# Patient Record
Sex: Male | Born: 1980 | Race: Black or African American | Hispanic: No | Marital: Single | Smoking: Current some day smoker
Health system: Southern US, Community
[De-identification: ages and names within clinical notes are randomized; demographics above are authoritative.]

## PROBLEM LIST (undated history)

## (undated) DIAGNOSIS — I219 Acute myocardial infarction, unspecified: Secondary | ICD-10-CM

## (undated) DIAGNOSIS — E785 Hyperlipidemia, unspecified: Secondary | ICD-10-CM

## (undated) DIAGNOSIS — M109 Gout, unspecified: Secondary | ICD-10-CM

## (undated) HISTORY — PX: WISDOM TOOTH EXTRACTION: SHX21

## (undated) HISTORY — PX: CARDIAC CATHETERIZATION: SHX172

---

## 2008-04-02 ENCOUNTER — Emergency Department (HOSPITAL_COMMUNITY): Admission: EM | Admit: 2008-04-02 | Discharge: 2008-04-02 | Payer: Self-pay | Admitting: Emergency Medicine

## 2008-05-26 ENCOUNTER — Emergency Department (HOSPITAL_COMMUNITY): Admission: EM | Admit: 2008-05-26 | Discharge: 2008-05-26 | Payer: Self-pay | Admitting: Emergency Medicine

## 2009-09-05 ENCOUNTER — Emergency Department (HOSPITAL_COMMUNITY): Admission: EM | Admit: 2009-09-05 | Discharge: 2009-09-05 | Payer: Self-pay | Admitting: Emergency Medicine

## 2009-12-28 ENCOUNTER — Emergency Department (HOSPITAL_COMMUNITY): Admission: EM | Admit: 2009-12-28 | Discharge: 2009-12-28 | Payer: Self-pay | Admitting: Emergency Medicine

## 2011-04-24 ENCOUNTER — Emergency Department (HOSPITAL_COMMUNITY)
Admission: EM | Admit: 2011-04-24 | Discharge: 2011-04-24 | Disposition: A | Discharge status: Home / Self Care | Payer: Self-pay | Attending: Emergency Medicine | Admitting: Emergency Medicine

## 2011-04-24 DIAGNOSIS — J45909 Unspecified asthma, uncomplicated: Secondary | ICD-10-CM | POA: Insufficient documentation

## 2011-04-24 DIAGNOSIS — S61209A Unspecified open wound of unspecified finger without damage to nail, initial encounter: Secondary | ICD-10-CM | POA: Insufficient documentation

## 2011-04-24 DIAGNOSIS — W268XXA Contact with other sharp object(s), not elsewhere classified, initial encounter: Secondary | ICD-10-CM | POA: Insufficient documentation

## 2013-01-01 ENCOUNTER — Encounter (HOSPITAL_COMMUNITY): Payer: Self-pay | Admitting: *Deleted

## 2013-01-01 ENCOUNTER — Emergency Department (HOSPITAL_COMMUNITY): Payer: BC Managed Care – PPO

## 2013-01-01 ENCOUNTER — Emergency Department (HOSPITAL_COMMUNITY)
Admission: EM | Admit: 2013-01-01 | Discharge: 2013-01-01 | Disposition: A | Discharge status: Home / Self Care | Payer: BC Managed Care – PPO | Attending: Emergency Medicine | Admitting: Emergency Medicine

## 2013-01-01 DIAGNOSIS — M109 Gout, unspecified: Secondary | ICD-10-CM | POA: Insufficient documentation

## 2013-01-01 DIAGNOSIS — F17200 Nicotine dependence, unspecified, uncomplicated: Secondary | ICD-10-CM | POA: Insufficient documentation

## 2013-01-01 MED ORDER — HYDROCODONE-ACETAMINOPHEN 5-325 MG PO TABS
1.0000 | ORAL_TABLET | Freq: Once | ORAL | Status: AC
  Administered 2013-01-01: 1 via ORAL
  Filled 2013-01-01: qty 1

## 2013-01-01 MED ORDER — IBUPROFEN 400 MG PO TABS
600.0000 mg | ORAL_TABLET | Freq: Once | ORAL | Status: AC
  Administered 2013-01-01: 600 mg via ORAL
  Filled 2013-01-01: qty 2

## 2013-01-01 MED ORDER — HYDROCODONE-ACETAMINOPHEN 5-325 MG PO TABS: 1.0000 | ORAL_TABLET | Freq: Four times a day (QID) | ORAL | Status: DC | PRN

## 2013-01-01 MED ORDER — IBUPROFEN 600 MG PO TABS: 600.0000 mg | ORAL_TABLET | Freq: Four times a day (QID) | ORAL | Status: DC | PRN

## 2013-01-01 MED ORDER — COLCHICINE 0.6 MG PO TABS
1.2000 mg | ORAL_TABLET | Freq: Once | ORAL | Status: AC
  Administered 2013-01-01: 1.2 mg via ORAL
  Filled 2013-01-01: qty 2

## 2013-01-01 NOTE — ED Notes (Signed)
Patient transported to X-ray 

## 2013-01-01 NOTE — Progress Notes (Signed)
Orthopedic Tech Progress Note Patient Details:  Evan Higgins 1981/02/16 [id number]  Ortho Devices Type of Ortho Device: Postop shoe/boot Ortho Device/Splint Location: RIGHT POST OP SHOE Ortho Device/Splint Interventions: Application   Cammer, Mickie Bail 01/01/2013, 10:26 AM

## 2013-01-01 NOTE — ED Notes (Signed)
Reports waking up yesterday morning with pain to right foot, denies injury to foot, family hx of gout.

## 2013-01-01 NOTE — ED Provider Notes (Signed)
History     CSN: [id number]  Arrival date & time 01/01/13  0758   First MD Initiated Contact with Patient 01/01/13 0805      Chief Complaint  Patient presents with  . Foot Pain    (Consider location/radiation/quality/duration/timing/severity/associated sxs/prior treatment) HPI Comments: Pain right sided great toe. No trauma. No hx of gout for self, but there is fam hx of gout.  Patient is a 32 y.o. male presenting with lower extremity pain. The history is provided by the patient.  Foot Pain This is a new problem. The current episode started yesterday. The problem occurs constantly. The problem has been gradually worsening. Pertinent negatives include no chest pain, no abdominal pain and no shortness of breath. The symptoms are aggravated by walking. The symptoms are relieved by relaxation. He has tried nothing for the symptoms.    History reviewed. No pertinent past medical history.  History reviewed. No pertinent past surgical history.  History reviewed. No pertinent family history.  History  Substance Use Topics  . Smoking status: Current Every Day Smoker    Types: Cigarettes  . Smokeless tobacco: Not on file  . Alcohol Use: No      Review of Systems  Constitutional: Negative for activity change and appetite change.  Respiratory: Negative for cough and shortness of breath.   Cardiovascular: Negative for chest pain.  Gastrointestinal: Negative for abdominal pain.  Genitourinary: Negative for dysuria.  Musculoskeletal: Positive for arthralgias.    Allergies  Review of patient's allergies indicates no known allergies.  Home Medications   Current Outpatient Rx  Name  Route  Sig  Dispense  Refill  . acetaminophen (TYLENOL) 500 MG tablet   Oral   Take 1,000 mg by mouth every 6 (six) hours as needed for pain.         Marland Kitchen HYDROcodone-acetaminophen (NORCO/VICODIN) 5-325 MG per tablet   Oral   Take 1 tablet by mouth every 6 (six) hours as needed for pain.   10  tablet   0   . ibuprofen (ADVIL,MOTRIN) 600 MG tablet   Oral   Take 1 tablet (600 mg total) by mouth every 6 (six) hours as needed for pain.   30 tablet   0     BP 133/75  Pulse 70  Temp(Src) 97.6 F (36.4 C) (Oral)  Resp 16  SpO2 98%  Physical Exam  Constitutional: He is oriented to person, place, and time. He appears well-developed.  HENT:  Head: Normocephalic and atraumatic.  Eyes: Conjunctivae and EOM are normal. Pupils are equal, round, and reactive to light.  Neck: Normal range of motion. Neck supple.  Cardiovascular: Normal rate, regular rhythm and normal heart sounds.   Pulmonary/Chest: Effort normal and breath sounds normal. No respiratory distress. He has no wheezes.  Abdominal: Soft. Bowel sounds are normal. He exhibits no distension. There is no tenderness. There is no rebound and no guarding.  Musculoskeletal:  Right great toe is edematous, mild erythema, no calor, tenderness to palpation and with passive ROM.  Neurological: He is alert and oriented to person, place, and time.  Skin: Skin is warm.    ED Course  Procedures (including critical care time)  Labs Reviewed - No data to display Dg Toe Great Right  01/01/2013  *RADIOLOGY REPORT*  Clinical Data: Pain.  No injury.  RIGHT GREAT TOE  Comparison: None.  Findings: No acute bony abnormality.  Specifically, no fracture, subluxation, or dislocation.  Soft tissues are intact. Joint spaces are maintained.  Normal  bone mineralization.  IMPRESSION: No acute bony abnormality.   Original Report Authenticated By: Charlett Nose, M.D.      1. Gout       MDM  Pt comes in with cc of great toe pain. Slight redness, and swelling - no callor. No risk factors for infection for this healthy male. Appears to be got clinically - location is classic, and there is red, swollen MTP. Will clinically dx, no uric acid ordered - as it is not diagnostic, and there is not enough effusion to drain fluid.  Advised to stay away from  red meat and etoh.   Derwood Kaplan, MD 01/01/13 [phone]

## 2013-01-24 ENCOUNTER — Encounter (HOSPITAL_COMMUNITY): Payer: Self-pay | Admitting: *Deleted

## 2013-01-24 ENCOUNTER — Emergency Department (HOSPITAL_COMMUNITY)
Admission: EM | Admit: 2013-01-24 | Discharge: 2013-01-24 | Disposition: A | Discharge status: Home / Self Care | Payer: BC Managed Care – PPO | Attending: Emergency Medicine | Admitting: Emergency Medicine

## 2013-01-24 DIAGNOSIS — F17200 Nicotine dependence, unspecified, uncomplicated: Secondary | ICD-10-CM | POA: Insufficient documentation

## 2013-01-24 DIAGNOSIS — M542 Cervicalgia: Secondary | ICD-10-CM | POA: Insufficient documentation

## 2013-01-24 DIAGNOSIS — K920 Hematemesis: Secondary | ICD-10-CM | POA: Insufficient documentation

## 2013-01-24 DIAGNOSIS — R112 Nausea with vomiting, unspecified: Secondary | ICD-10-CM | POA: Insufficient documentation

## 2013-01-24 DIAGNOSIS — M109 Gout, unspecified: Secondary | ICD-10-CM | POA: Insufficient documentation

## 2013-01-24 DIAGNOSIS — Z79899 Other long term (current) drug therapy: Secondary | ICD-10-CM | POA: Insufficient documentation

## 2013-01-24 DIAGNOSIS — R51 Headache: Secondary | ICD-10-CM | POA: Insufficient documentation

## 2013-01-24 HISTORY — DX: Gout, unspecified: M10.9

## 2013-01-24 LAB — COMPREHENSIVE METABOLIC PANEL
Alkaline Phosphatase: 70 U/L (ref 39–117)
BUN: 12 mg/dL (ref 6–23)
CO2: 27 mEq/L (ref 19–32)
Calcium: 9.1 mg/dL (ref 8.4–10.5)
Chloride: 102 mEq/L (ref 96–112)
Creatinine, Ser: 0.92 mg/dL (ref 0.50–1.35)
GFR calc Af Amer: >90 mL/min (ref >90)
Glucose, Bld: 108 mg/dL — ABNORMAL HIGH (ref 70–99)
Sodium: 139 mEq/L (ref 135–145)
Total Bilirubin: 0.3 mg/dL (ref 0.3–1.2)

## 2013-01-24 LAB — CBC WITH DIFFERENTIAL/PLATELET
Basophils Absolute: 0 10*3/uL (ref 0.0–0.1)
Eosinophils Absolute: 0.4 10*3/uL (ref 0.0–0.7)
Eosinophils Relative: 5 % (ref 0–5)
Hemoglobin: 13.9 g/dL (ref 13.0–17.0)
Lymphocytes Relative: 25 % (ref 12–46)
Neutro Abs: 4.4 10*3/uL (ref 1.7–7.7)
RBC: 4.55 MIL/uL (ref 4.22–5.81)
RDW: 12.1 % (ref 11.5–15.5)
WBC: 7.1 10*3/uL (ref 4.0–10.5)

## 2013-01-24 LAB — OCCULT BLOOD, POC DEVICE: Fecal Occult Bld: NEGATIVE

## 2013-01-24 MED ORDER — HYDROMORPHONE HCL PF 1 MG/ML IJ SOLN
1.0000 mg | Freq: Once | INTRAMUSCULAR | Status: AC
  Administered 2013-01-24: 1 mg via INTRAVENOUS
  Filled 2013-01-24: qty 1

## 2013-01-24 MED ORDER — PROMETHAZINE HCL 25 MG PO TABS: 25.0000 mg | ORAL_TABLET | Freq: Four times a day (QID) | ORAL | Status: DC | PRN

## 2013-01-24 MED ORDER — ACETAMINOPHEN 500 MG PO TABS
1000.0000 mg | ORAL_TABLET | Freq: Once | ORAL | Status: AC
  Administered 2013-01-24: 1000 mg via ORAL
  Filled 2013-01-24: qty 2

## 2013-01-24 MED ORDER — SODIUM CHLORIDE 0.9 % IV BOLUS (SEPSIS)
1000.0000 mL | Freq: Once | INTRAVENOUS | Status: AC
  Administered 2013-01-24: 1000 mL via INTRAVENOUS

## 2013-01-24 MED ORDER — PANTOPRAZOLE SODIUM 40 MG IV SOLR
40.0000 mg | Freq: Once | INTRAVENOUS | Status: AC
  Administered 2013-01-24: 40 mg via INTRAVENOUS
  Filled 2013-01-24: qty 40

## 2013-01-24 NOTE — ED Notes (Signed)
JYN:[id number]<AO> Expected date:<BR> Expected time:<BR> Means of arrival:<BR> Comments:<BR> EMS/31 yo male-vomited blood

## 2013-01-24 NOTE — ED Provider Notes (Signed)
History     CSN: [id number]  Arrival date & time 01/24/13  5621   First MD Initiated Contact with Patient 01/24/13 [phone]      Chief Complaint  Patient presents with  . Hematemesis    (Consider location/radiation/quality/duration/timing/severity/associated sxs/prior treatment) HPI Comments: This is a 32 year old male who presents today with numerous episodes of hematemesis. He states that yesterday morning he fell around 1045. He hit his mouth and swallowed some blood. He only hit his mouth and not his head, no LOC. He began vomiting blood early this morning. He also has history of getting his wisdom teeth removed 1 week ago. He states last night he developed a headache that gradually increased in intensity. When he woke up at 430am it was severe. He took some tylenol 3 that he received from his surgery. This did not help. The headache is on the top of his head and is associated with photophobia. He notes black tarry stools x 3 days. No abdominal pain, fevers, diarrhea, CP, shortness of breath. No history of PUD. He denies drinking alcohol. He recently had gout, but states he has not been taking the ibuprofen prescribed.   Patient is a 32 y.o. male presenting with vomiting. The history is provided by the patient and the spouse. No language interpreter was used.  Emesis Severity:  Moderate Duration:  2 hours Timing:  Constant Emesis appearance: dark red blood. Progression:  Unchanged Chronicity:  New Associated symptoms: headaches   Associated symptoms: no abdominal pain, no diarrhea and no fever   Headaches:    Severity:  Severe   Onset quality:  Gradual   Duration:  10 hours   Timing:  Constant   Progression:  Worsening   Chronicity:  New Risk factors: no alcohol use, no diabetes, no prior abdominal surgery and no suspect food intake     Past Medical History  Diagnosis Date  . Gout     Past Surgical History  Procedure Laterality Date  . Wisdom tooth extraction      No  family history on file.  History  Substance Use Topics  . Smoking status: Current Every Day Smoker -- 0.50 packs/day    Types: Cigarettes  . Smokeless tobacco: Not on file  . Alcohol Use: No      Review of Systems  Constitutional: Negative for fever.  HENT: Negative for congestion.   Respiratory: Negative for shortness of breath.   Cardiovascular: Negative for chest pain.  Gastrointestinal: Positive for nausea, vomiting and blood in stool. Negative for abdominal pain, diarrhea and anal bleeding.  Neurological: Positive for headaches. Negative for light-headedness.  All other systems reviewed and are negative.    Allergies  Review of patient's allergies indicates no known allergies.  Home Medications   Current Outpatient Rx  Name  Route  Sig  Dispense  Refill  . acetaminophen-codeine (TYLENOL #3) 300-30 MG per tablet   Oral   Take 1 tablet by mouth every 4 (four) hours as needed for pain.         Marland Kitchen HYDROcodone-acetaminophen (NORCO/VICODIN) 5-325 MG per tablet   Oral   Take 1 tablet by mouth every 6 (six) hours as needed for pain.   10 tablet   0   . ibuprofen (ADVIL,MOTRIN) 600 MG tablet   Oral   Take 1 tablet (600 mg total) by mouth every 6 (six) hours as needed for pain.   30 tablet   0   . penicillin v potassium (VEETID) 500  MG tablet   Oral   Take 500 mg by mouth 4 (four) times daily.         Marland Kitchen acetaminophen (TYLENOL) 500 MG tablet   Oral   Take 1,000 mg by mouth every 6 (six) hours as needed for pain.           BP 119/68  Pulse 55  Temp(Src) 98.4 F (36.9 C) (Oral)  Resp 16  SpO2 100%  Physical Exam  Nursing note and vitals reviewed. Constitutional: He is oriented to person, place, and time. Vital signs are normal. He appears well-developed and well-nourished. He is cooperative. He does not have a sickly appearance. No distress.  HENT:  Head: Normocephalic and atraumatic.  Right Ear: External ear normal.  Left Ear: External ear normal.   Mouth/Throat: Uvula is midline, oropharynx is clear and moist and mucous membranes are normal.  Eyes: Conjunctivae, EOM and lids are normal. Pupils are equal, round, and reactive to light.  Neck: Trachea normal, normal range of motion and phonation normal. No rigidity. No tracheal deviation present.  Patient complains of pain and tenderness in posterior neck, but moves neck freely  Cardiovascular: Normal rate, regular rhythm, normal heart sounds, intact distal pulses and normal pulses.  Exam reveals no gallop and no friction rub.   No murmur heard. Pulmonary/Chest: Effort normal and breath sounds normal. No stridor. No respiratory distress. He has no wheezes.  Abdominal: Soft. Normal appearance and bowel sounds are normal. He exhibits no distension. There is no tenderness. There is no rigidity and no guarding.  Genitourinary: Rectum normal. Rectal exam shows no external hemorrhoid, no internal hemorrhoid, no fissure, no mass, no tenderness and anal tone normal. Guaiac negative stool.  Musculoskeletal: Normal range of motion. He exhibits tenderness.       Cervical back: He exhibits tenderness (with normal ROM ).  Lymphadenopathy:    He has no cervical adenopathy.  Neurological: He is alert and oriented to person, place, and time. He has normal strength. No sensory deficit.  Skin: Skin is warm and dry. No rash noted. No erythema.  Psychiatric: He has a normal mood and affect. His behavior is normal.    ED Course  Procedures (including critical care time)  Labs Reviewed  COMPREHENSIVE METABOLIC PANEL - Abnormal; Notable for the following:    Glucose, Bld 108 (*)    All other components within normal limits  CBC WITH DIFFERENTIAL  PROTIME-INR  APTT  OCCULT BLOOD, POC DEVICE   No results found.   1. Hematemesis/vomiting blood   2. Headache       MDM  Patient presents with hematemesis for 8 hours. NG tube placed and flushed. Hemoccult negative. No signs of active bleed. Headache  began gradually and got worse. ROM of neck not restricted. Afebrile, vital signs WNL through course of ED stay. No concern for SAH, meningitis at this time. Patient states he is feeling much better. Likely swallowed blood when he fell and then vomited stomach contents. Strict return precautions given including return with recurrent hematemesis, vomiting that cannot be controlled, increased sudden headache, neck stiffness, fever. The attending saw this patient and agrees with plan. Patient / Family / Caregiver informed of clinical course, understand medical decision-making process, and agree with plan.       Mora Bellman, PA-C 01/24/13 [phone]

## 2013-01-24 NOTE — ED Notes (Signed)
Pt to ER via EMS for complaints of vomiting blood; pt reports that he had his wisdom teeth pulled approx 1 week and had not had an difficulty from that ; pt reports that he fell while carrying a box 3/27 am around 10:45 and lip and mouth were bleeding; pt states that he seemed fine the rest of the day; pt reports that he woke up around 4:30am "not feeling right"; EMS reports that pt was actively vomiting upon their arrival; EMS reports that pt vomited 50cc of dark red emesis and that after vomiting pt was pale, clammy and diaphoretic, pt was assisted to stretcher by EMS; EMS reports that after getting on stretcher pt symptoms improved and was no longer pale, clammy or diaphoretic. Pt denies nausea upon arrival to ER; pt c/o headache rates at a 3 on a scale of 0-10.

## 2013-01-25 NOTE — ED Provider Notes (Signed)
Medical screening examination/treatment/procedure(s) were conducted as a shared visit with non-physician practitioner(s) and myself.  I personally evaluated the patient during the encounter Pt with hematemisis several hours after swallowing blood from a lip laceration.  States vomited x 3.  No epigastric pain.  No hx of heavy NSAID use.  No vomiting in ED.  Will check khgb, NGT to assess for active bleeding.  Rolan Bucco, MD 01/25/13 [phone]

## 2014-08-01 ENCOUNTER — Emergency Department (INDEPENDENT_AMBULATORY_CARE_PROVIDER_SITE_OTHER)
Admission: EM | Admit: 2014-08-01 | Discharge: 2014-08-01 | Disposition: A | Discharge status: Home / Self Care | Payer: Self-pay | Source: Home / Self Care | Attending: Family Medicine | Admitting: Family Medicine

## 2014-08-01 ENCOUNTER — Other Ambulatory Visit (HOSPITAL_COMMUNITY)
Admission: RE | Admit: 2014-08-01 | Discharge: 2014-08-01 | Disposition: A | Discharge status: Home / Self Care | Payer: Self-pay | Source: Ambulatory Visit | Attending: Family Medicine | Admitting: Family Medicine

## 2014-08-01 ENCOUNTER — Encounter (HOSPITAL_COMMUNITY): Payer: Self-pay | Admitting: Emergency Medicine

## 2014-08-01 DIAGNOSIS — R1031 Right lower quadrant pain: Secondary | ICD-10-CM

## 2014-08-01 DIAGNOSIS — N200 Calculus of kidney: Secondary | ICD-10-CM

## 2014-08-01 DIAGNOSIS — R319 Hematuria, unspecified: Secondary | ICD-10-CM

## 2014-08-01 DIAGNOSIS — Z113 Encounter for screening for infections with a predominantly sexual mode of transmission: Secondary | ICD-10-CM | POA: Insufficient documentation

## 2014-08-01 LAB — POCT URINALYSIS DIP (DEVICE)
Bilirubin Urine: NEGATIVE
Glucose, UA: NEGATIVE mg/dL
KETONES UR: NEGATIVE mg/dL
Leukocytes, UA: NEGATIVE
Nitrite: NEGATIVE
PH: 7 (ref 5.0–8.0)
PROTEIN: NEGATIVE mg/dL
SPECIFIC GRAVITY, URINE: 1.01 (ref 1.005–1.030)
Urobilinogen, UA: 0.2 mg/dL (ref 0.0–1.0)

## 2014-08-01 LAB — POCT I-STAT, CHEM 8
BUN: 7 mg/dL (ref 6–23)
CREATININE: 1.1 mg/dL (ref 0.50–1.35)
Calcium, Ion: 1.22 mmol/L (ref 1.12–1.23)
Chloride: 100 mEq/L (ref 96–112)
Glucose, Bld: 89 mg/dL (ref 70–99)
HCT: 52 % (ref 39.0–52.0)
Hemoglobin: 17.7 g/dL — ABNORMAL HIGH (ref 13.0–17.0)
Potassium: 4.2 mEq/L (ref 3.7–5.3)
Sodium: 139 mEq/L (ref 137–147)
TCO2: 28 mmol/L (ref 0–100)

## 2014-08-01 MED ORDER — HYDROCODONE-ACETAMINOPHEN 5-325 MG PO TABS: 1.0000 | ORAL_TABLET | Freq: Four times a day (QID) | ORAL | Status: DC | PRN

## 2014-08-01 NOTE — ED Provider Notes (Signed)
CSN: [id number]     Arrival date & time 08/01/14  0903 History   First MD Initiated Contact with Patient 08/01/14 [phone]     Chief Complaint  Patient presents with  . Hematuria   (Consider location/radiation/quality/duration/timing/severity/associated sxs/prior Treatment) HPI Comments: Patient is a 33 yo black male who presents with painless hematuria. Onset last night. This am, was lessened in the stream, but squeezed penis and noted some blood. Also had a "wave" of RLQ pain that has came and went since this am. He denies fever, chills. No known STD exposure that he is aware. No penile lesions. No additional change in urinary symptoms. No GI symptoms.   Patient is a 33 y.o. male presenting with hematuria. The history is provided by the patient.  Hematuria Associated symptoms include abdominal pain.    Past Medical History  Diagnosis Date  . Gout    Past Surgical History  Procedure Laterality Date  . Wisdom tooth extraction     Family History  Problem Relation Age of Onset  . Liver disease Mother    History  Substance Use Topics  . Smoking status: Current Every Day Smoker -- 0.50 packs/day    Types: Cigarettes  . Smokeless tobacco: Not on file  . Alcohol Use: Yes     Comment: occasional    Review of Systems  Constitutional: Negative for fever and chills.  HENT: Negative.   Gastrointestinal: Positive for abdominal pain. Negative for nausea, vomiting, diarrhea, constipation, blood in stool, abdominal distention and anal bleeding.  Genitourinary: Positive for hematuria. Negative for dysuria, urgency, frequency, flank pain, decreased urine volume, penile swelling, genital sores and penile pain.  Skin: Negative.   Neurological: Negative.   Psychiatric/Behavioral: Negative.     Allergies  Review of patient's allergies indicates no known allergies.  Home Medications   Prior to Admission medications   Medication Sig Start Date End Date Taking? Authorizing Provider   acetaminophen (TYLENOL) 500 MG tablet Take 1,000 mg by mouth every 6 (six) hours as needed for pain.    Historical Provider, MD  acetaminophen-codeine (TYLENOL #3) 300-30 MG per tablet Take 1 tablet by mouth every 4 (four) hours as needed for pain.    Historical Provider, MD  HYDROcodone-acetaminophen (NORCO/VICODIN) 5-325 MG per tablet Take 1 tablet by mouth every 6 (six) hours as needed for pain. 01/01/13   Derwood Kaplan, MD  HYDROcodone-acetaminophen (NORCO/VICODIN) 5-325 MG per tablet Take 1-2 tablets by mouth every 6 (six) hours as needed. 08/01/14   Riki Sheer, PA-C  ibuprofen (ADVIL,MOTRIN) 600 MG tablet Take 1 tablet (600 mg total) by mouth every 6 (six) hours as needed for pain. 01/01/13   Derwood Kaplan, MD  penicillin v potassium (VEETID) 500 MG tablet Take 500 mg by mouth 4 (four) times daily.    Historical Provider, MD  promethazine (PHENERGAN) 25 MG tablet Take 1 tablet (25 mg total) by mouth every 6 (six) hours as needed for nausea. 01/24/13   Mora Bellman, PA-C   BP 129/84  Pulse 67  Temp(Src) 98.2 F (36.8 C) (Oral)  Resp 18  SpO2 99% Physical Exam  Nursing note and vitals reviewed. Constitutional: He is oriented to person, place, and time. He appears well-developed and well-nourished. No distress.  HENT:  Head: Normocephalic and atraumatic.  Cardiovascular: Normal rate, regular rhythm and normal heart sounds.   Pulmonary/Chest: Effort normal and breath sounds normal. No respiratory distress.  Abdominal: Soft. Bowel sounds are normal. He exhibits no distension and no mass. There  is no tenderness. There is no rebound and no guarding.  Neurological: He is alert and oriented to person, place, and time.  Skin: Skin is warm and dry. No rash noted. He is not diaphoretic.  Psychiatric: His behavior is normal.    ED Course  Procedures (including critical care time) Labs Review Labs Reviewed  POCT URINALYSIS DIP (DEVICE) - Abnormal; Notable for the following:    Hgb  urine dipstick MODERATE (*)    All other components within normal limits  POCT I-STAT, CHEM 8 - Abnormal; Notable for the following:    Hemoglobin 17.7 (*)    All other components within normal limits  URINE CYTOLOGY ANCILLARY ONLY    Imaging Review No results found.   MDM   1. Hematuria   2. RLQ abdominal pain   3. Nephrolithiasis    Scr. Normal. Suspect Nephrolithiasis. Pain lower quadrant at this point. Send home with a strainer. F/U with Urology (info given). If worsens then please return to the ER for CT and further evaluation. STD also sent for completeness.     Riki SheerMichelle G Young, PA-C 08/01/14 1024

## 2014-08-01 NOTE — Discharge Instructions (Signed)
Kidney Stones °Kidney stones (urolithiasis) are deposits that form inside your kidneys. The intense pain is caused by the stone moving through the urinary tract. When the stone moves, the ureter goes into spasm around the stone. The stone is usually passed in the urine.  °CAUSES  °· A disorder that makes certain neck glands produce too much parathyroid hormone (primary hyperparathyroidism). °· A buildup of uric acid crystals, similar to gout in your joints. °· Narrowing (stricture) of the ureter. °· A kidney obstruction present at birth (congenital obstruction). °· Previous surgery on the kidney or ureters. °· Numerous kidney infections. °SYMPTOMS  °· Feeling sick to your stomach (nauseous). °· Throwing up (vomiting). °· Blood in the urine (hematuria). °· Pain that usually spreads (radiates) to the groin. °· Frequency or urgency of urination. °DIAGNOSIS  °· Taking a history and physical exam. °· Blood or urine tests. °· CT scan. °· Occasionally, an examination of the inside of the urinary bladder (cystoscopy) is performed. °TREATMENT  °· Observation. °· Increasing your fluid intake. °· Extracorporeal shock wave lithotripsy--This is a noninvasive procedure that uses shock waves to break up kidney stones. °· Surgery may be needed if you have severe pain or persistent obstruction. There are various surgical procedures. Most of the procedures are performed with the use of small instruments. Only small incisions are needed to accommodate these instruments, so recovery time is minimized. °The size, location, and chemical composition are all important variables that will determine the proper choice of action for you. Talk to your health care provider to better understand your situation so that you will minimize the risk of injury to yourself and your kidney.  °HOME CARE INSTRUCTIONS  °· Drink enough water and fluids to keep your urine clear or pale yellow. This will help you to pass the stone or stone fragments. °· Strain  all urine through the provided strainer. Keep all particulate matter and stones for your health care provider to see. The stone causing the pain may be as small as a grain of salt. It is very important to use the strainer each and every time you pass your urine. The collection of your stone will allow your health care provider to analyze it and verify that a stone has actually passed. The stone analysis will often identify what you can do to reduce the incidence of recurrences. °· Only take over-the-counter or prescription medicines for pain, discomfort, or fever as directed by your health care provider. °· Make a follow-up appointment with your health care provider as directed. °· Get follow-up X-rays if required. The absence of pain does not always mean that the stone has passed. It may have only stopped moving. If the urine remains completely obstructed, it can cause loss of kidney function or even complete destruction of the kidney. It is your responsibility to make sure X-rays and follow-ups are completed. Ultrasounds of the kidney can show blockages and the status of the kidney. Ultrasounds are not associated with any radiation and can be performed easily in a matter of minutes. °SEEK MEDICAL CARE IF: °· You experience pain that is progressive and unresponsive to any pain medicine you have been prescribed. °SEEK IMMEDIATE MEDICAL CARE IF:  °· Pain cannot be controlled with the prescribed medicine. °· You have a fever or shaking chills. °· The severity or intensity of pain increases over 18 hours and is not relieved by pain medicine. °· You develop a new onset of abdominal pain. °· You feel faint or pass out. °·   You are unable to urinate. MAKE SURE YOU:   Understand these instructions.  Will watch your condition.  Will get help right away if you are not doing well or get worse. Document Released: 10/16/2005 Document Revised: 06/18/2013 Document Reviewed: 03/19/2013 [facility]ExitCare Patient Information 2015  [place], [place]. This information is not intended to replace advice given to you by your health care provider. Make sure you discuss any questions you have with your health care provider.  Hematuria Hematuria is blood in your urine. It can be caused by a bladder infection, kidney infection, prostate infection, kidney stone, or cancer of your urinary tract. Infections can usually be treated with medicine, and a kidney stone usually will pass through your urine. If neither of these is the cause of your hematuria, further workup to find out the reason may be needed. It is very important that you tell your health care provider about any blood you see in your urine, even if the blood stops without treatment or happens without causing pain. Blood in your urine that happens and then stops and then happens again can be a symptom of a very serious condition. Also, pain is not a symptom in the initial stages of many urinary cancers. HOME CARE INSTRUCTIONS   Drink lots of fluid, 3-4 quarts a day. If you have been diagnosed with an infection, cranberry juice is especially recommended, in addition to large amounts of water.  Avoid caffeine, tea, and carbonated beverages because they tend to irritate the bladder.  Avoid alcohol because it may irritate the prostate.  Take all medicines as directed by your health care provider.  If you were prescribed an antibiotic medicine, finish it all even if you start to feel better.  If you have been diagnosed with a kidney stone, follow your health care provider's instructions regarding straining your urine to catch the stone.  Empty your bladder often. Avoid holding urine for long periods of time.  After a bowel movement, women should cleanse front to back. Use each tissue only once.  Empty your bladder before and after sexual intercourse if you are a male. SEEK MEDICAL CARE IF:  You develop back pain.  You have a fever.  You have a feeling of sickness in your  stomach (nausea) or vomiting.  Your symptoms are not better in 3 days. Return sooner if you are getting worse. SEEK IMMEDIATE MEDICAL CARE IF:   You develop severe vomiting and are unable to keep the medicine down.  You develop severe back or abdominal pain despite taking your medicines.  You begin passing a large amount of blood or clots in your urine.  You feel extremely weak or faint, or you pass out. MAKE SURE YOU:   Understand these instructions.  Will watch your condition.  Will get help right away if you are not doing well or get worse. Document Released: 10/16/2005 Document Revised: 03/02/2014 Document Reviewed: 06/16/2013 [facility] IncExitCare Patient Information 2015 [place], [place]. This information is not intended to replace advice given to you by your health care provider. Make sure you discuss any questions you have with your health care provider.     Suspect a kidney stone. Use a strainer with urination, if passes then may keep and f/u with a Urologist. Numbers given to follow up. If your pain worsens, and symptoms become more severe then follow up at the ER. Will call you if the reports of the urine show otherwise once we have them back

## 2014-08-01 NOTE — ED Notes (Signed)
C/o hematuria last night.  He drank water and still had it this AM.  States last night it was at the beginning and end of urination.  This AM it was just at the beginning.  When he squeezed his penis it was pure blood- no pus.  C/o RLQ abd. pain onset this AM.

## 2014-08-02 NOTE — ED Provider Notes (Signed)
Medical screening examination/treatment/procedure(s) were performed by a resident physician or non-physician practitioner and as the supervising physician I was immediately available for consultation/collaboration.  Evan Corey, MD    Evan S Corey, MD 08/02/14 1215 

## 2014-08-03 LAB — URINE CYTOLOGY ANCILLARY ONLY
Chlamydia: NEGATIVE
Neisseria Gonorrhea: NEGATIVE
Trichomonas: NEGATIVE

## 2016-08-18 ENCOUNTER — Encounter (HOSPITAL_COMMUNITY): Payer: Self-pay | Admitting: Emergency Medicine

## 2016-08-18 ENCOUNTER — Encounter (HOSPITAL_COMMUNITY): Payer: Self-pay | Admitting: *Deleted

## 2016-08-18 ENCOUNTER — Emergency Department (HOSPITAL_COMMUNITY)
Admission: EM | Admit: 2016-08-18 | Discharge: 2016-08-19 | Disposition: A | Discharge status: Home / Self Care | Payer: Self-pay | Attending: Emergency Medicine | Admitting: Emergency Medicine

## 2016-08-18 ENCOUNTER — Emergency Department (HOSPITAL_COMMUNITY): Payer: Self-pay

## 2016-08-18 ENCOUNTER — Emergency Department (HOSPITAL_COMMUNITY)
Admission: EM | Admit: 2016-08-18 | Discharge: 2016-08-18 | Disposition: A | Discharge status: Home / Self Care | Payer: Self-pay | Attending: Emergency Medicine | Admitting: Emergency Medicine

## 2016-08-18 DIAGNOSIS — R110 Nausea: Secondary | ICD-10-CM | POA: Insufficient documentation

## 2016-08-18 DIAGNOSIS — F17210 Nicotine dependence, cigarettes, uncomplicated: Secondary | ICD-10-CM | POA: Insufficient documentation

## 2016-08-18 DIAGNOSIS — R519 Headache, unspecified: Secondary | ICD-10-CM

## 2016-08-18 DIAGNOSIS — R51 Headache: Secondary | ICD-10-CM | POA: Insufficient documentation

## 2016-08-18 DIAGNOSIS — Z79899 Other long term (current) drug therapy: Secondary | ICD-10-CM | POA: Insufficient documentation

## 2016-08-18 DIAGNOSIS — G4482 Headache associated with sexual activity: Secondary | ICD-10-CM | POA: Insufficient documentation

## 2016-08-18 LAB — CBC WITH DIFFERENTIAL/PLATELET
BASOS PCT: 1 %
Basophils Absolute: 0 10*3/uL (ref 0.0–0.1)
Eosinophils Absolute: 0.5 10*3/uL (ref 0.0–0.7)
Eosinophils Relative: 8 %
HCT: 40.6 % (ref 39.0–52.0)
HEMOGLOBIN: 13.6 g/dL (ref 13.0–17.0)
LYMPHS ABS: 2.6 10*3/uL (ref 0.7–4.0)
Lymphocytes Relative: 43 %
MCH: 31.3 pg (ref 26.0–34.0)
MCHC: 33.5 g/dL (ref 30.0–36.0)
MCV: 93.5 fL (ref 78.0–100.0)
Monocytes Absolute: 0.4 10*3/uL (ref 0.1–1.0)
Monocytes Relative: 7 %
NEUTROS PCT: 41 %
Neutro Abs: 2.4 10*3/uL (ref 1.7–7.7)
Platelets: 153 10*3/uL (ref 150–400)
RBC: 4.34 MIL/uL (ref 4.22–5.81)
RDW: 11.9 % (ref 11.5–15.5)
WBC: 5.8 10*3/uL (ref 4.0–10.5)

## 2016-08-18 LAB — COMPREHENSIVE METABOLIC PANEL
ALBUMIN: 4.4 g/dL (ref 3.5–5.0)
ALK PHOS: 67 U/L (ref 38–126)
ALT: 18 U/L (ref 17–63)
AST: 25 U/L (ref 15–41)
Anion gap: 5 (ref 5–15)
BUN: 10 mg/dL (ref 6–20)
CALCIUM: 9.4 mg/dL (ref 8.9–10.3)
CHLORIDE: 104 mmol/L (ref 101–111)
CO2: 29 mmol/L (ref 22–32)
CREATININE: 0.9 mg/dL (ref 0.61–1.24)
GFR calc Af Amer: >60 mL/min (ref >60)
GFR calc non Af Amer: >60 mL/min (ref >60)
GLUCOSE: 90 mg/dL (ref 65–99)
Potassium: 3.7 mmol/L (ref 3.5–5.1)
SODIUM: 138 mmol/L (ref 135–145)
Total Bilirubin: 0.8 mg/dL (ref 0.3–1.2)
Total Protein: 6.8 g/dL (ref 6.5–8.1)

## 2016-08-18 MED ORDER — DIPHENHYDRAMINE HCL 50 MG/ML IJ SOLN
12.5000 mg | Freq: Once | INTRAMUSCULAR | Status: AC
  Administered 2016-08-18: 12.5 mg via INTRAVENOUS
  Filled 2016-08-18: qty 1

## 2016-08-18 MED ORDER — IOPAMIDOL (ISOVUE-370) INJECTION 76%
100.0000 mL | Freq: Once | INTRAVENOUS | Status: AC | PRN
  Administered 2016-08-18: 100 mL via INTRAVENOUS

## 2016-08-18 MED ORDER — METOCLOPRAMIDE HCL 5 MG/ML IJ SOLN
10.0000 mg | Freq: Once | INTRAMUSCULAR | Status: AC
  Administered 2016-08-18: 10 mg via INTRAVENOUS
  Filled 2016-08-18: qty 2

## 2016-08-18 MED ORDER — SODIUM CHLORIDE 0.9 % IV BOLUS (SEPSIS)
500.0000 mL | Freq: Once | INTRAVENOUS | Status: AC
  Administered 2016-08-18: 500 mL via INTRAVENOUS

## 2016-08-18 NOTE — ED Provider Notes (Signed)
[facility]-EMERGENCY DEPT Provider Note   CSN: [id number] Arrival date & time: 08/18/16  1632     History   Chief Complaint Chief Complaint  Patient presents with  . Migraine    HPI Evan Higgins is a 35 y.o. male.  Patient without significant medical history presents with complaint of severe headache today while involved in sexual activity with headache starting at the time of orgasm. He states he had similar symptoms 2 days ago in the same situation. Headache 2 days ago was described as generalized, throbbing and resolved about one hour after sexual activity. Today the headache was worse and has improved but persists. He had nausea but no vomiting. No history of headaches previously. He reports photophobia but no visual changes.    The history is provided by the patient. No language interpreter was used.    Past Medical History:  Diagnosis Date  . Gout     There are no active problems to display for this patient.   Past Surgical History:  Procedure Laterality Date  . WISDOM TOOTH EXTRACTION         Home Medications    Prior to Admission medications   Medication Sig Start Date End Date Taking? Authorizing Provider  Chlorphen-Phenyleph-ASA (ALKA-SELTZER PLUS COLD) 2-7.8-325 MG TBEF Take 1 tablet by mouth once.   Yes Historical Provider, MD    Family History Family History  Problem Relation Age of Onset  . Liver disease Mother     Social History Social History  Substance Use Topics  . Smoking status: Current Every Day Smoker    Packs/day: 0.50    Types: Cigarettes  . Smokeless tobacco: Never Used  . Alcohol use Yes     Comment: occasional     Allergies   Review of patient's allergies indicates no known allergies.   Review of Systems Review of Systems  Constitutional: Negative for chills and fever.  HENT: Negative for congestion, sinus pressure, sore throat and trouble swallowing.   Eyes: Positive for photophobia. Negative for pain and visual  disturbance.  Respiratory: Negative.  Negative for shortness of breath.   Cardiovascular: Negative.  Negative for chest pain.  Gastrointestinal: Positive for nausea. Negative for abdominal pain and vomiting.  Genitourinary: Negative for discharge, scrotal swelling and testicular pain.  Musculoskeletal: Negative.  Negative for neck pain and neck stiffness.  Neurological: Positive for headaches. Negative for syncope, weakness and light-headedness.     Physical Exam Updated Vital Signs BP 137/76   Pulse 72   Temp 98.4 F (36.9 C) (Oral)   Resp 18   SpO2 99%   Physical Exam  Constitutional: He is oriented to person, place, and time. He appears well-developed and well-nourished.  HENT:  Head: Normocephalic and atraumatic.  Eyes: EOM are normal. Pupils are equal, round, and reactive to light.  Neck: Normal range of motion.  Cardiovascular: Normal rate and regular rhythm.   No murmur heard. Pulmonary/Chest: Effort normal and breath sounds normal. He has no wheezes. He has no rales.  Abdominal: Soft. There is no tenderness.  Neurological: He is alert and oriented to person, place, and time. He has normal strength. No sensory deficit. He displays a negative Romberg sign.  CN's 3-12 grossly intact. No deficits of coordination, gait imbalance, or speech abnormalities.   Skin: Skin is warm and dry.     ED Treatments / Results  Labs (all labs ordered are listed, but only abnormal results are displayed) Labs Reviewed - No data to display  EKG  EKG Interpretation None       Radiology No results found. Ct Angio Head W Or Wo Contrast  Result Date: 08/18/2016 CLINICAL DATA:  Orgasmic headache. EXAM: CT ANGIOGRAPHY HEAD AND NECK TECHNIQUE: Multidetector CT imaging of the head and neck was performed using the standard protocol during bolus administration of intravenous contrast. Multiplanar CT image reconstructions and MIPs were obtained to evaluate the vascular anatomy. Carotid  stenosis measurements (when applicable) are obtained utilizing NASCET criteria, using the distal internal carotid diameter as the denominator. CONTRAST:  100 cc Isovue 370 intravenous COMPARISON:  12/28/2009 head CT FINDINGS: CT HEAD FINDINGS Brain: No evidence of acute infarction, hemorrhage, hydrocephalus, extra-axial collection or mass lesion/mass effect. Vascular: See below Skull: Negative Sinuses: Negative Orbits: Negative Review of the MIP images confirms the above findings CTA NECK FINDINGS Aortic arch: 2 vessel branching.  No pathologic finding. Right carotid system: Smooth and widely patent.  No atherosclerosis. Left carotid system: Smooth and widely patent.  No atherosclerosis. Vertebral arteries: No proximal subclavian narrowing. Right dominant vertebral artery. Both vertebral arteries are smooth and widely patent to the dura. Skeleton: No acute or aggressive finding. Other neck: Prominent adenoid tonsil, symmetric and also seen on scanogram from 2011. Upper chest: Negative Review of the MIP images confirms the above findings CTA HEAD FINDINGS Anterior circulation: Symmetric carotid arteries and branching. Intact circle-of-Willis with small anterior communicating artery. Questionable subtle beading in the ACA distribution. There is no flow limiting stenosis or aneurysm. Posterior circulation: Right dominant vertebrobasilar system. Prominent beaded appearance of the distal right PCA and branches. No major branch occlusion or aneurysm. Venous sinuses: Patent Anatomic variants: None significant Delayed phase: No parenchymal enhancement or mass. 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 No evidence of subarachnoid hemorrhage. Electronically Signed   By: Marnee Spring M.D.   On: 08/18/2016 20:58   Ct Angio  Neck W And/or Wo Contrast  Result Date: 08/18/2016 CLINICAL DATA:  Orgasmic headache. EXAM: CT ANGIOGRAPHY HEAD AND NECK TECHNIQUE: Multidetector CT imaging of the head and neck was performed using the standard protocol during bolus administration of intravenous contrast. Multiplanar CT image reconstructions and MIPs were obtained to evaluate the vascular anatomy. Carotid stenosis measurements (when applicable) are obtained utilizing NASCET criteria, using the distal internal carotid diameter as the denominator. CONTRAST:  100 cc Isovue 370 intravenous COMPARISON:  12/28/2009 head CT FINDINGS: CT HEAD FINDINGS Brain: No evidence of acute infarction, hemorrhage, hydrocephalus, extra-axial collection or mass lesion/mass effect. Vascular: See below Skull: Negative Sinuses: Negative Orbits: Negative Review of the MIP images confirms the above findings CTA NECK FINDINGS Aortic arch: 2 vessel branching.  No pathologic finding. Right carotid system: Smooth and widely patent.  No atherosclerosis. Left carotid system: Smooth and widely patent.  No atherosclerosis. Vertebral arteries: No proximal subclavian narrowing. Right dominant vertebral artery. Both vertebral arteries are smooth and widely patent to the dura. Skeleton: No acute or aggressive finding. Other neck: Prominent adenoid tonsil, symmetric and also seen on scanogram from 2011. Upper chest: Negative Review of the MIP images confirms the above findings CTA HEAD FINDINGS Anterior circulation: Symmetric carotid arteries and branching. Intact circle-of-Willis with small anterior communicating artery. Questionable subtle beading in the ACA distribution. There is no flow limiting stenosis or aneurysm. Posterior circulation: Right dominant vertebrobasilar system. Prominent beaded appearance of the distal right PCA and branches. No major  branch occlusion or aneurysm. Venous sinuses: Patent Anatomic variants: None significant Delayed phase: No parenchymal enhancement  or mass. 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 No evidence of subarachnoid hemorrhage. Electronically Signed   By: Marnee Spring M.D.   On: 08/18/2016 20:58    Procedures Procedures (including critical care time)  Medications Ordered in ED Medications - No data to display   Initial Impression / Assessment and Plan / ED Course  I have reviewed the triage vital signs and the nursing notes.  Pertinent labs & imaging results that were available during my care of the patient were reviewed by me and considered in my medical decision making (see chart for details).  Clinical Course    Patient with severe headache onset twice this week associated with orgasm. He is currently comfortable. No neurologic deficits on exam. VSS. Will get head CT for first time, sudden onset headache (CTA head and neck).   CTA's are negative. Patient is feeling much better without intervention. 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   Final Clinical Impressions(s) / ED Diagnoses   Final diagnoses:  None   1. Orgasmic headache  New Prescriptions New Prescriptions   No medications on file     Elpidio Anis, Cordelia Poche 08/20/16 0410    Lyndal Pulley, MD 08/21/16 [phone]

## 2016-08-18 NOTE — ED Triage Notes (Signed)
Pt was just discharged and showed back up to registration wishing to check back in.  States he felt better when he left but as soon as he walked out the door the pain came back.  Moaning "help me" in triage. Head is covered with blanket.

## 2016-08-18 NOTE — Discharge Instructions (Signed)
Follow up with neurology for further management of headache. Return to the ED with any concerning symptoms.

## 2016-08-18 NOTE — ED Triage Notes (Signed)
Per EMS, pt from home complains of headache 15 minutes after smoking marijuana ~1 before arrival to ED. Pt states he is sensitive to light.

## 2016-08-19 NOTE — ED Provider Notes (Signed)
[facility]ER[facility] DEPT Provider Note   CSN: [id number] Arrival date & time: 08/18/16  2141     History   Chief Complaint Chief Complaint  Patient presents with  . Headache    HPI Jacque Garrels is a 35 y.o. male.  Patient seen for the second time this evening for severe headache that started after sexual activity at the point of orgasm. He had same symptoms 2 days ago that resolved completely. Tonight the headache was worsen and longer lasting. 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 He felt better and was discharged. On walking out of the emergency department, he had another episode of sudden onset, severe headache pain, diaphoresis and nausea, and so checked in again for further management.    The history is provided by the patient. No language interpreter was used.  Headache   Associated symptoms include nausea. Pertinent negatives include no fever.    Past Medical History:  Diagnosis Date  . Gout     There are no active problems to display for this patient.   Past Surgical History:  Procedure Laterality Date  . WISDOM TOOTH EXTRACTION         Home Medications    Prior to Admission medications   Medication Sig Start Date End Date Taking? Authorizing Provider  Chlorphen-Phenyleph-ASA (ALKA-SELTZER PLUS COLD) 2-7.8-325 MG TBEF Take 1 tablet by mouth once.   Yes Historical Provider, MD    Family History Family History  Problem Relation Age of Onset  . Liver disease Mother     Social History Social History  Substance Use Topics  . Smoking status: Current Every Day Smoker    Packs/day: 0.50    Types: Cigarettes  . Smokeless tobacco: Never Used  . Alcohol use Yes     Comment: occasional     Allergies   Review of patient's allergies indicates no known allergies.   Review of Systems Review of Systems  Constitutional: Positive for diaphoresis. Negative  for chills and fever.  Respiratory: Negative.   Cardiovascular: Negative.   Gastrointestinal: Positive for nausea.  Musculoskeletal: Negative.   Skin: Negative.   Neurological: Positive for headaches.     Physical Exam Updated Vital Signs There were no vitals taken for this visit.  Physical Exam  Constitutional: He is oriented to person, place, and time. He appears well-developed and well-nourished.  HENT:  Head: Normocephalic and atraumatic.  Eyes: EOM are normal. Pupils are equal, round, and reactive to light.  Neck: Normal range of motion.  Cardiovascular: Normal rate and regular rhythm.   No murmur heard. Pulmonary/Chest: Effort normal and breath sounds normal. He has no wheezes. He has no rales.  Abdominal: Soft. There is no tenderness.  Neurological: He is alert and oriented to person, place, and time. He has normal strength. No sensory deficit. He displays a negative Romberg sign.  CN's 3-12 intact. Ambulatory without ataxia. No deficits of coordination. Speech clear and focused.   Skin: Skin is warm and dry.     ED Treatments / Results  Labs (all labs ordered are listed, but only abnormal results are displayed) Labs Reviewed  CBC WITH DIFFERENTIAL/PLATELET  COMPREHENSIVE METABOLIC PANEL   Results for orders placed or performed during the hospital encounter of 08/18/16  CBC with Differential  Result Value Ref Range   WBC 5.8 4.0 - 10.5 K/uL   RBC 4.34 4.22 - 5.81 MIL/uL   Hemoglobin 13.6 13.0 - 17.0 g/dL  HCT 40.6 39.0 - 52.0 %   MCV 93.5 78.0 - 100.0 fL   MCH 31.3 26.0 - 34.0 pg   MCHC 33.5 30.0 - 36.0 g/dL   RDW 13.011.9 86.511.5 - 78.415.5 %   Platelets 153 150 - 400 K/uL   Neutrophils Relative % 41 %   Neutro Abs 2.4 1.7 - 7.7 K/uL   Lymphocytes Relative 43 %   Lymphs Abs 2.6 0.7 - 4.0 K/uL   Monocytes Relative 7 %   Monocytes Absolute 0.4 0.1 - 1.0 K/uL   Eosinophils Relative 8 %   Eosinophils Absolute 0.5 0.0 - 0.7 K/uL   Basophils Relative 1 %   Basophils  Absolute 0.0 0.0 - 0.1 K/uL  Comprehensive metabolic panel  Result Value Ref Range   Sodium 138 135 - 145 mmol/L   Potassium 3.7 3.5 - 5.1 mmol/L   Chloride 104 101 - 111 mmol/L   CO2 29 22 - 32 mmol/L   Glucose, Bld 90 65 - 99 mg/dL   BUN 10 6 - 20 mg/dL   Creatinine, Ser 6.960.90 0.61 - 1.24 mg/dL   Calcium 9.4 8.9 - 29.510.3 mg/dL   Total Protein 6.8 6.5 - 8.1 g/dL   Albumin 4.4 3.5 - 5.0 g/dL   AST 25 15 - 41 U/L   ALT 18 17 - 63 U/L   Alkaline Phosphatase 67 38 - 126 U/L   Total Bilirubin 0.8 0.3 - 1.2 mg/dL   GFR calc non Af Amer >60 >60 mL/min   GFR calc Af Amer >60 >60 mL/min   Anion gap 5 5 - 15    EKG  EKG Interpretation None       Radiology Ct Angio Head W Or Wo Contrast  Result Date: 08/18/2016 CLINICAL DATA:  Orgasmic headache. EXAM: CT ANGIOGRAPHY HEAD AND NECK TECHNIQUE: Multidetector CT imaging of the head and neck was performed using the standard protocol during bolus administration of intravenous contrast. Multiplanar CT image reconstructions and MIPs were obtained to evaluate the vascular anatomy. Carotid stenosis measurements (when applicable) are obtained utilizing NASCET criteria, using the distal internal carotid diameter as the denominator. CONTRAST:  100 cc Isovue 370 intravenous COMPARISON:  12/28/2009 head CT FINDINGS: CT HEAD FINDINGS Brain: No evidence of acute infarction, hemorrhage, hydrocephalus, extra-axial collection or mass lesion/mass effect. Vascular: See below Skull: Negative Sinuses: Negative Orbits: Negative Review of the MIP images confirms the above findings CTA NECK FINDINGS Aortic arch: 2 vessel branching.  No pathologic finding. Right carotid system: Smooth and widely patent.  No atherosclerosis. Left carotid system: Smooth and widely patent.  No atherosclerosis. Vertebral arteries: No proximal subclavian narrowing. Right dominant vertebral artery. Both vertebral arteries are smooth and widely patent to the dura. Skeleton: No acute or aggressive  finding. Other neck: Prominent adenoid tonsil, symmetric and also seen on scanogram from 2011. Upper chest: Negative Review of the MIP images confirms the above findings CTA HEAD FINDINGS Anterior circulation: Symmetric carotid arteries and branching. Intact circle-of-Willis with small anterior communicating artery. Questionable subtle beading in the ACA distribution. There is no flow limiting stenosis or aneurysm. Posterior circulation: Right dominant vertebrobasilar system. Prominent beaded appearance of the distal right PCA and branches. No major branch occlusion or aneurysm. Venous sinuses: Patent Anatomic variants: None significant Delayed phase: No parenchymal enhancement or mass. 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 No  evidence of subarachnoid hemorrhage. Electronically Signed   By: Marnee Spring M.D.   On: 08/18/2016 20:58   Ct Angio Neck W And/or Wo Contrast  Result Date: 08/18/2016 CLINICAL DATA:  Orgasmic headache. EXAM: CT ANGIOGRAPHY HEAD AND NECK TECHNIQUE: Multidetector CT imaging of the head and neck was performed using the standard protocol during bolus administration of intravenous contrast. Multiplanar CT image reconstructions and MIPs were obtained to evaluate the vascular anatomy. Carotid stenosis measurements (when applicable) are obtained utilizing NASCET criteria, using the distal internal carotid diameter as the denominator. CONTRAST:  100 cc Isovue 370 intravenous COMPARISON:  12/28/2009 head CT FINDINGS: CT HEAD FINDINGS Brain: No evidence of acute infarction, hemorrhage, hydrocephalus, extra-axial collection or mass lesion/mass effect. Vascular: See below Skull: Negative Sinuses: Negative Orbits: Negative Review of the MIP images confirms the above findings CTA NECK FINDINGS  Aortic arch: 2 vessel branching.  No pathologic finding. Right carotid system: Smooth and widely patent.  No atherosclerosis. Left carotid system: Smooth and widely patent.  No atherosclerosis. Vertebral arteries: No proximal subclavian narrowing. Right dominant vertebral artery. Both vertebral arteries are smooth and widely patent to the dura. Skeleton: No acute or aggressive finding. Other neck: Prominent adenoid tonsil, symmetric and also seen on scanogram from 2011. Upper chest: Negative Review of the MIP images confirms the above findings CTA HEAD FINDINGS Anterior circulation: Symmetric carotid arteries and branching. Intact circle-of-Willis with small anterior communicating artery. Questionable subtle beading in the ACA distribution. There is no flow limiting stenosis or aneurysm. Posterior circulation: Right dominant vertebrobasilar system. Prominent beaded appearance of the distal right PCA and branches. No major branch occlusion or aneurysm. Venous sinuses: Patent Anatomic variants: None significant Delayed phase: No parenchymal enhancement or mass. 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 No evidence of subarachnoid hemorrhage. Electronically Signed   By: Marnee Spring M.D.   On: 08/18/2016 20:58    Procedures Procedures (including critical care time)  Medications Ordered in ED Medications  sodium chloride 0.9 % bolus 500 mL (500 mLs Intravenous New Bag/Given 08/18/16 2249)  metoCLOPramide (REGLAN) injection 10 mg (10 mg Intravenous Given 08/18/16 2251)  diphenhydrAMINE (BENADRYL) injection 12.5 mg (12.5 mg Intravenous Given 08/18/16 2251)     Initial Impression / Assessment and Plan / ED Course  I have reviewed the triage vital signs and the nursing notes.  Pertinent labs & imaging  results that were available during my care of the patient were reviewed by me and considered in my medical decision making (see chart for details).  Clinical Course    Patient given reglan and benadryl with resolution of headache. He slowly sat up and brought to standing position without recurrent headache. He reports he feels much better and is ready for discharge home.   Final Clinical Impressions(s) / ED Diagnoses   Final diagnoses:  None   1. Headache  New Prescriptions New Prescriptions   No medications on file     Elpidio Anis, Cordelia Poche 08/19/16 0014    Lyndal Pulley, MD 08/19/16 4166

## 2016-08-19 NOTE — Discharge Instructions (Signed)
Follow up with neurology for further evaluation of headache if it recurs.

## 2016-09-06 ENCOUNTER — Ambulatory Visit: Payer: Self-pay | Admitting: Neurology

## 2016-09-06 ENCOUNTER — Telehealth: Payer: Self-pay

## 2016-09-06 NOTE — Telephone Encounter (Signed)
Patient did not show to new patient appt.  

## 2016-09-11 ENCOUNTER — Encounter: Payer: Self-pay | Admitting: Neurology

## 2018-07-15 IMAGING — CT CT ANGIO NECK
2 of 9 series · 7 of 33 positions shown · IV contrast (ISOVUE 370)
Comparison: 12/28/2009 head CT

CLINICAL DATA: Orgasmic headache.

EXAM:
CT ANGIOGRAPHY HEAD AND NECK
TECHNIQUE: Multidetector CT imaging of the head and neck was performed using
the standard protocol during bolus administration of intravenous
contrast. Multiplanar CT image reconstructions and MIPs were
obtained to evaluate the vascular anatomy. Carotid stenosis
measurements (when applicable) are obtained utilizing NASCET
criteria, using the distal internal carotid diameter as the
denominator.
CONTRAST:  100 cc Isovue 370 intravenous

[Series 8: head/neck · axial · 0.43mm/px · z∈[+1222,+1350]mm · 2 of 193 slices shown]
[im 65/193  soft-tissue]
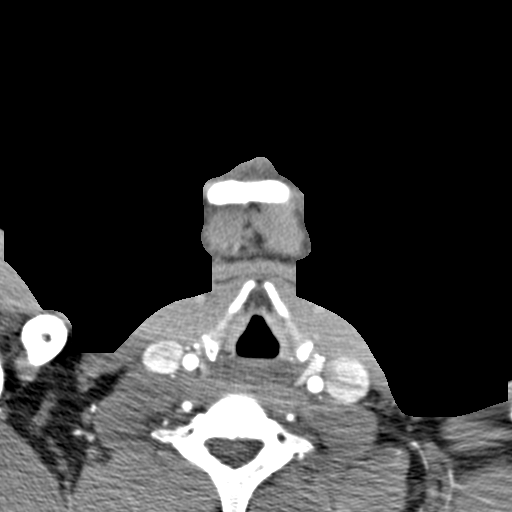
[im 129/193  bone]
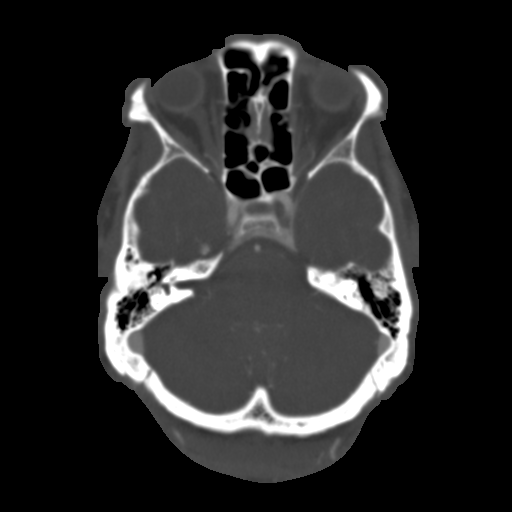

[Series 10: axial thin · axial · 0.36mm/px · z∈[+1172,+1418]mm · 5 of 370 slices shown]
[im 62/370  soft-tissue]
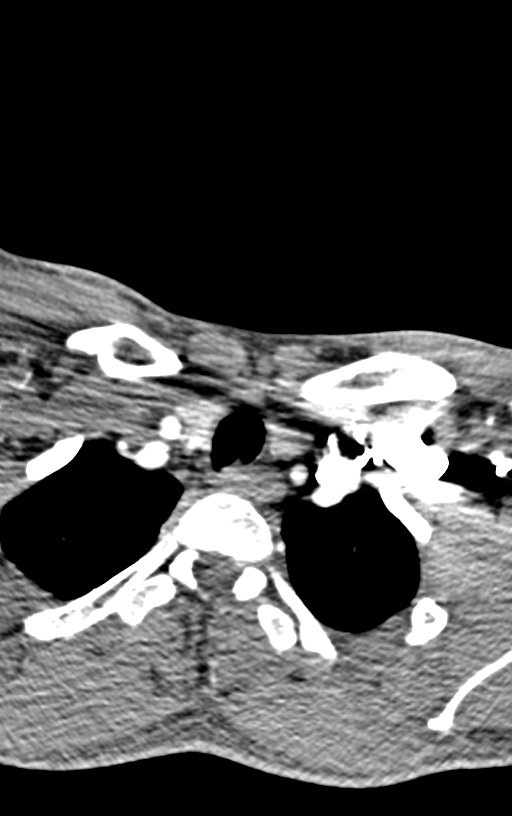
[im 124/370  soft-tissue]
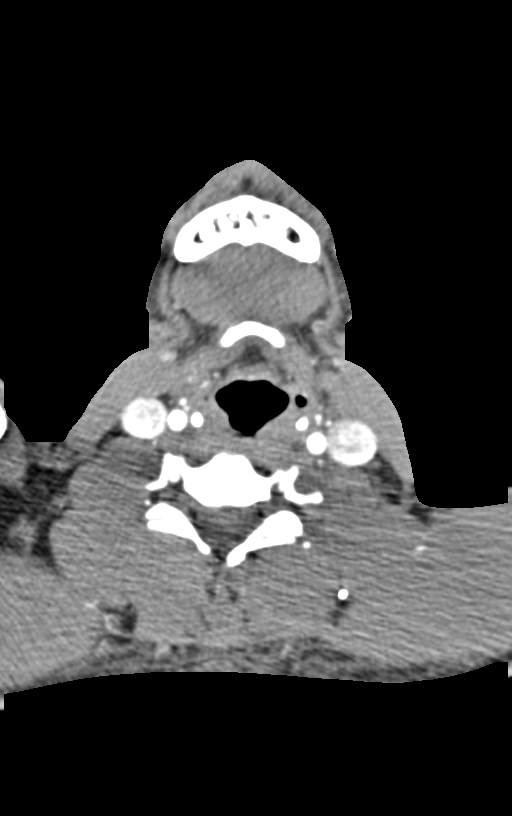
[im 185/370  soft-tissue]
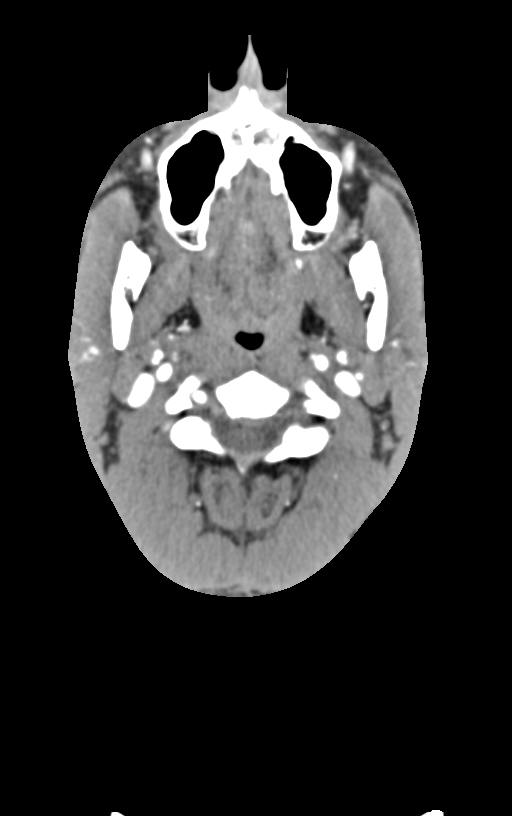
[im 247/370  soft-tissue]
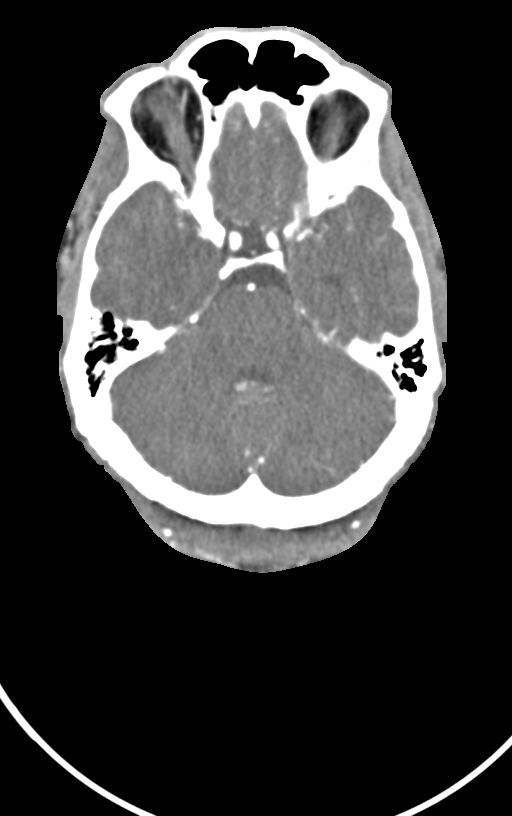
[im 308/370  soft-tissue]
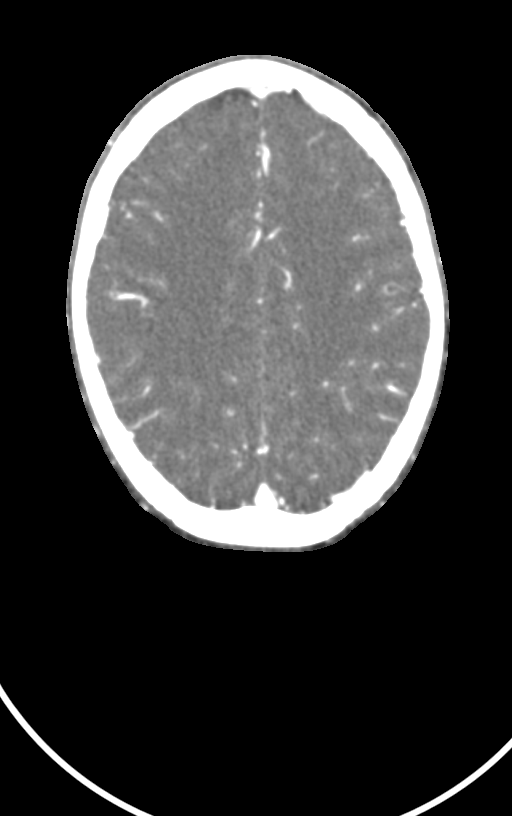

[7 of 33 positions shown; findings below may reference images not displayed]

FINDINGS: CT HEAD FINDINGS

Brain: No evidence of acute infarction, hemorrhage, hydrocephalus,
extra-axial collection or mass lesion/mass effect.

Vascular: See below

Skull: Negative

Sinuses: Negative

Orbits: Negative

Review of the MIP images confirms the above findings

CTA NECK FINDINGS

Aortic arch: 2 vessel branching.  No pathologic finding.

Right carotid system: Smooth and widely patent.  No atherosclerosis.

Left carotid system: Smooth and widely patent.  No atherosclerosis.

Vertebral arteries: No proximal subclavian narrowing. Right dominant
vertebral artery. Both vertebral arteries are smooth and widely
patent to the dura.

Skeleton: No acute or aggressive finding.

Other neck: Prominent adenoid tonsil, symmetric and also seen on
scanogram from 5033.

Upper chest: Negative

Review of the MIP images confirms the above findings

CTA HEAD FINDINGS

Anterior circulation: Symmetric carotid arteries and branching.
Intact circle-of-Willis with small anterior communicating artery.
Questionable subtle beading in the ACA distribution. There is no
flow limiting stenosis or aneurysm.

Posterior circulation: Right dominant vertebrobasilar system.
Prominent beaded appearance of the distal right PCA and branches. No
major branch occlusion or aneurysm.

Venous sinuses: Patent

Anatomic variants: None significant

Delayed phase: No parenchymal enhancement or mass.

Review of the MIP images confirms the above findings
IMPRESSION: Beaded appearance of vessels especially in the right PCA
distribution suggesting reversible cerebrovasoconstrictive syndrome
- which has an association with orgasmic headaches. No evidence of
subarachnoid hemorrhage.

## 2023-11-28 ENCOUNTER — Other Ambulatory Visit (HOSPITAL_COMMUNITY): Payer: Self-pay

## 2023-11-29 ENCOUNTER — Other Ambulatory Visit (HOSPITAL_COMMUNITY): Payer: Self-pay

## 2023-11-29 ENCOUNTER — Other Ambulatory Visit: Payer: Self-pay

## 2023-11-29 MED ORDER — ATORVASTATIN CALCIUM 80 MG PO TABS
80.0000 mg | ORAL_TABLET | Freq: Every day | ORAL | 0 refills | Status: DC
  Filled 2023-11-29: qty 30, 30d supply, fill #0

## 2023-11-29 MED ORDER — NYSTATIN 100000 UNIT/GM EX POWD
1.0000 | Freq: Two times a day (BID) | CUTANEOUS | 2 refills | Status: AC
  Filled 2023-11-29 (×2): qty 60, 30d supply, fill #0
  Filled 2024-05-13: qty 60, 30d supply, fill #1

## 2023-11-29 MED ORDER — GABAPENTIN 300 MG PO CAPS
ORAL_CAPSULE | ORAL | 0 refills | Status: DC
  Filled 2023-11-29: qty 90, 23d supply, fill #0

## 2023-11-29 MED ORDER — MONTELUKAST SODIUM 10 MG PO TABS
10.0000 mg | ORAL_TABLET | Freq: Every day | ORAL | 0 refills | Status: DC
  Filled 2023-11-29: qty 30, 30d supply, fill #0

## 2023-11-29 MED ORDER — METOPROLOL SUCCINATE ER 25 MG PO TB24
50.0000 mg | ORAL_TABLET | Freq: Every day | ORAL | 0 refills | Status: DC
  Filled 2023-11-29: qty 60, 30d supply, fill #0

## 2023-11-29 MED ORDER — CETIRIZINE HCL 10 MG PO TABS
10.0000 mg | ORAL_TABLET | Freq: Two times a day (BID) | ORAL | 5 refills | Status: DC
  Filled 2023-11-29: qty 30, 15d supply, fill #0
  Filled 2024-01-03: qty 30, 15d supply, fill #1

## 2023-11-29 MED ORDER — LISINOPRIL 5 MG PO TABS
5.0000 mg | ORAL_TABLET | Freq: Every day | ORAL | 0 refills | Status: DC
  Filled 2023-11-29: qty 30, 30d supply, fill #0

## 2023-11-30 ENCOUNTER — Other Ambulatory Visit (HOSPITAL_COMMUNITY): Payer: Self-pay

## 2023-12-09 ENCOUNTER — Other Ambulatory Visit: Payer: Self-pay

## 2023-12-09 ENCOUNTER — Emergency Department (HOSPITAL_COMMUNITY): Payer: MEDICAID

## 2023-12-09 ENCOUNTER — Emergency Department (HOSPITAL_COMMUNITY)
Admission: EM | Admit: 2023-12-09 | Discharge: 2023-12-10 | Discharge status: Against Medical Advice | Payer: MEDICAID | Attending: Emergency Medicine | Admitting: Emergency Medicine

## 2023-12-09 DIAGNOSIS — Z5321 Procedure and treatment not carried out due to patient leaving prior to being seen by health care provider: Secondary | ICD-10-CM | POA: Insufficient documentation

## 2023-12-09 DIAGNOSIS — R079 Chest pain, unspecified: Secondary | ICD-10-CM | POA: Insufficient documentation

## 2023-12-09 LAB — CBC
HCT: 40.9 % (ref 39.0–52.0)
Hemoglobin: 13.2 g/dL (ref 13.0–17.0)
MCH: 31.8 pg (ref 26.0–34.0)
MCHC: 32.3 g/dL (ref 30.0–36.0)
MCV: 98.6 fL (ref 80.0–100.0)
Platelets: 203 10*3/uL (ref 150–400)
RBC: 4.15 MIL/uL — ABNORMAL LOW (ref 4.22–5.81)
RDW: 11.6 % (ref 11.5–15.5)
WBC: 6.8 10*3/uL (ref 4.0–10.5)
nRBC: 0 % (ref 0.0–0.2)

## 2023-12-09 LAB — BASIC METABOLIC PANEL
Anion gap: 9 (ref 5–15)
BUN: 11 mg/dL (ref 6–20)
CO2: 29 mmol/L (ref 22–32)
Calcium: 9.7 mg/dL (ref 8.9–10.3)
Chloride: 102 mmol/L (ref 98–111)
Creatinine, Ser: 0.94 mg/dL (ref 0.61–1.24)
GFR, Estimated: >60 mL/min (ref >60)
Glucose, Bld: 96 mg/dL (ref 70–99)
Potassium: 4.6 mmol/L (ref 3.5–5.1)
Sodium: 140 mmol/L (ref 135–145)

## 2023-12-09 LAB — TROPONIN I (HIGH SENSITIVITY)
Troponin I (High Sensitivity): 8 ng/L (ref <18)
Troponin I (High Sensitivity): 8 ng/L (ref <18)

## 2023-12-09 NOTE — ED Triage Notes (Signed)
 Pt to ED c/o chest pain that started 20 mins ago , which is now subsiding. Denies CP. Denies N/V.

## 2023-12-09 NOTE — ED Provider Triage Note (Signed)
 Emergency Medicine Provider Triage Evaluation Note  Evan Higgins , a 43 y.o. male  was evaluated in triage.  Pt complains of CP x 15 min PTA. Hx stent  Review of Systems  Positive: CP (travels up and down center chest Negative: N/V/D, fever, chills, cough, congestion, SHOB  Physical Exam  There were no vitals taken for this visit. Gen:   Awake, no distress   Resp:  Normal effort  MSK:   Moves extremities without difficulty  Other:  Not diaphoretic   Medical Decision Making  Medically screening exam initiated at 4:07 PM.  Appropriate orders placed.  Evan Higgins was informed that the remainder of the evaluation will be completed by another provider, this initial triage assessment does not replace that evaluation, and the importance of remaining in the ED until their evaluation is complete.  Labs and imaging ordered   Evan Higgins Evan Higgins, [place] 12/09/23 [id number]

## 2023-12-10 NOTE — ED Notes (Signed)
Called pt x3, no answer.  

## 2023-12-18 ENCOUNTER — Other Ambulatory Visit (HOSPITAL_COMMUNITY): Payer: Self-pay

## 2023-12-18 MED ORDER — METRONIDAZOLE 500 MG PO TABS
2000.0000 mg | ORAL_TABLET | Freq: Once | ORAL | 0 refills | Status: AC
  Filled 2023-12-18: qty 4, 1d supply, fill #0

## 2023-12-18 MED ORDER — AZITHROMYCIN 250 MG PO TABS
1000.0000 mg | ORAL_TABLET | Freq: Once | ORAL | 0 refills | Status: AC
  Filled 2023-12-18: qty 4, 1d supply, fill #0

## 2023-12-27 ENCOUNTER — Other Ambulatory Visit (HOSPITAL_COMMUNITY): Payer: Self-pay

## 2023-12-27 MED ORDER — BRILINTA 90 MG PO TABS
90.0000 mg | ORAL_TABLET | Freq: Two times a day (BID) | ORAL | 1 refills | Status: DC
  Filled 2023-12-27: qty 180, 90d supply, fill #0
  Filled 2024-04-10: qty 180, 90d supply, fill #1

## 2023-12-27 MED ORDER — NITROGLYCERIN 0.4 MG SL SUBL
0.4000 mg | SUBLINGUAL_TABLET | SUBLINGUAL | 0 refills | Status: DC | PRN
Start: 2023-12-27 — End: 2024-08-29
  Filled 2023-12-27: qty 25, 10d supply, fill #0

## 2023-12-27 MED ORDER — NICOTINE 14 MG/24HR TD PT24
14.0000 mg | MEDICATED_PATCH | Freq: Every day | TRANSDERMAL | 0 refills | Status: DC
  Filled 2023-12-27: qty 28, 28d supply, fill #0

## 2023-12-27 MED ORDER — ASPIRIN 81 MG PO CHEW
81.0000 mg | CHEWABLE_TABLET | Freq: Every day | ORAL | 3 refills | Status: AC
  Filled 2023-12-27: qty 30, 30d supply, fill #0
  Filled 2024-02-21: qty 30, 30d supply, fill #1
  Filled 2024-04-10: qty 30, 30d supply, fill #2
  Filled 2024-05-13: qty 30, 30d supply, fill #3
  Filled 2024-10-13: qty 30, 30d supply, fill #4

## 2024-01-03 ENCOUNTER — Other Ambulatory Visit (HOSPITAL_COMMUNITY): Payer: Self-pay

## 2024-01-03 ENCOUNTER — Other Ambulatory Visit: Payer: Self-pay

## 2024-01-04 ENCOUNTER — Other Ambulatory Visit (HOSPITAL_COMMUNITY): Payer: Self-pay

## 2024-01-07 MED ORDER — ATORVASTATIN CALCIUM 80 MG PO TABS
80.0000 mg | ORAL_TABLET | Freq: Every day | ORAL | 0 refills | Status: DC
  Filled 2024-01-07: qty 30, 30d supply, fill #0

## 2024-01-07 MED ORDER — METOPROLOL SUCCINATE ER 25 MG PO TB24
50.0000 mg | ORAL_TABLET | Freq: Every day | ORAL | 0 refills | Status: DC
Start: 2024-01-07 — End: 2024-02-21
  Filled 2024-01-07: qty 60, 30d supply, fill #0

## 2024-01-07 MED ORDER — LISINOPRIL 5 MG PO TABS
5.0000 mg | ORAL_TABLET | Freq: Every day | ORAL | 0 refills | Status: DC
  Filled 2024-01-07: qty 30, 30d supply, fill #0

## 2024-01-08 ENCOUNTER — Other Ambulatory Visit (HOSPITAL_COMMUNITY): Payer: Self-pay

## 2024-01-16 ENCOUNTER — Other Ambulatory Visit (HOSPITAL_COMMUNITY): Payer: Self-pay

## 2024-01-16 MED ORDER — HYDROXYZINE HCL 25 MG PO TABS
25.0000 mg | ORAL_TABLET | Freq: Every day | ORAL | 0 refills | Status: DC
  Filled 2024-01-16: qty 60, 30d supply, fill #0
  Filled 2024-05-13: qty 60, 30d supply, fill #1

## 2024-01-16 MED ORDER — DULOXETINE HCL 30 MG PO CPEP
30.0000 mg | ORAL_CAPSULE | Freq: Every day | ORAL | 0 refills | Status: DC
Start: 2024-01-16 — End: 2024-08-28
  Filled 2024-01-16: qty 90, 90d supply, fill #0

## 2024-02-21 ENCOUNTER — Other Ambulatory Visit: Payer: Self-pay

## 2024-02-21 ENCOUNTER — Other Ambulatory Visit (HOSPITAL_COMMUNITY): Payer: Self-pay

## 2024-02-21 MED ORDER — METOPROLOL SUCCINATE ER 25 MG PO TB24
50.0000 mg | ORAL_TABLET | Freq: Every day | ORAL | 0 refills | Status: DC
  Filled 2024-02-21: qty 60, 30d supply, fill #0

## 2024-02-21 MED ORDER — LISINOPRIL 5 MG PO TABS
5.0000 mg | ORAL_TABLET | Freq: Every day | ORAL | 0 refills | Status: AC
Start: 2024-02-21 — End: ?
  Filled 2024-02-21: qty 30, 30d supply, fill #0

## 2024-02-21 MED ORDER — ATORVASTATIN CALCIUM 80 MG PO TABS
80.0000 mg | ORAL_TABLET | Freq: Every day | ORAL | 0 refills | Status: DC
  Filled 2024-02-21: qty 30, 30d supply, fill #0

## 2024-02-22 ENCOUNTER — Other Ambulatory Visit (HOSPITAL_COMMUNITY): Payer: Self-pay

## 2024-02-28 ENCOUNTER — Encounter (HOSPITAL_BASED_OUTPATIENT_CLINIC_OR_DEPARTMENT_OTHER): Payer: Self-pay | Admitting: Cardiovascular Disease

## 2024-02-28 ENCOUNTER — Ambulatory Visit (HOSPITAL_BASED_OUTPATIENT_CLINIC_OR_DEPARTMENT_OTHER): Payer: MEDICAID | Admitting: Cardiovascular Disease

## 2024-02-28 VITALS — BP 128/82 | HR 55 | Ht 73.0 in | Wt 177.0 lb

## 2024-02-28 DIAGNOSIS — R079 Chest pain, unspecified: Secondary | ICD-10-CM | POA: Diagnosis not present

## 2024-02-28 DIAGNOSIS — Z720 Tobacco use: Secondary | ICD-10-CM

## 2024-02-28 DIAGNOSIS — I25752 Atherosclerosis of native coronary artery of transplanted heart with refractory angina pectoris: Secondary | ICD-10-CM | POA: Diagnosis not present

## 2024-02-28 DIAGNOSIS — M79602 Pain in left arm: Secondary | ICD-10-CM | POA: Diagnosis not present

## 2024-02-28 DIAGNOSIS — Z5900 Homelessness unspecified: Secondary | ICD-10-CM

## 2024-02-28 NOTE — Progress Notes (Signed)
 Advanced Hypertension Clinic Initial Assessment:    Date:  03/08/2024   ID:  Evan Higgins, DOB Oct 12, 1981, MRN [id number]  PCP:  Trenton Frock, FNP  Cardiologist:  None  Nephrologist:  Referring MD: Trenton Frock, FNP   CC: Hypertension  History of Present Illness:    Evan Higgins is a 43 y.o. male with a hx of CAD status post stenting, hypertension, tobacco abuse, and hyperlipidemia here to establish care in the Advanced Hypertension Clinic.   He was seen in the ED 12/2023 with chest pain.  Cardiac enzymes were negative.  Discussed the use of AI scribe software for clinical note transcription with the patient, who gave verbal consent to proceed.  History of Present Illness Evan Higgins is a 43 year old male with a history of myocardial infarction who presents with persistent chest pain.  He experienced a myocardial infarction in September 2024 which occurred while he was asleep. He awoke with a burning sensation from his chest to his throat, prompting him to call for an ambulance. Cath revealed 100% mid LAD stenosis.  He had PCI with a 4.0 x 15 Xience DES with 0% residual stenosis.  Echo revealed LVEF 55-60% with apical hypokinesis.  GLS =14.8%. It was otherwise unremarkable.  Since then, he has had persistent daily chest pain, described as 'hurting right now.' He also experiences numbness in his left arm and leg, which he associates with the heart attack. 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  His current medications include baby aspirin  and atorvastatin , which he takes regularly. He has stopped taking lisinopril , prescribed for blood pressure and kidney protection, due to perceived kidney pain after ingestion.  He mentions taking another medication twice daily for heart and kidney treatment but is unsure of its name. He is concerned about taking medication for his kidneys, as he believes  there is nothing wrong with them.  He is currently experiencing homelessness, [place], which affects his ability to attend medical appointments and maintain a consistent diet. He reports significant weight loss and financial difficulties, having sold personal belongings to manage expenses. He has not accessed unemployment benefits, disability, or tax refunds, exacerbating his financial strain.  He follows a plant-based diet, avoiding meat and processed foods, and has significantly reduced his smoking habits, including cigarettes and marijuana. His breathing is generally good, but he experiences numbness and tingling in his left side, which he attributes to nerve issues. He has not had any issues with his neck in the past.  He has a large family but feels unsupported by them in his current situation. He expresses frustration with his inability to secure stable housing or financial assistance despite his previous income and work history.   Previous antihypertensives:   Past Medical History:  Diagnosis Date   Gout     Past Surgical History:  Procedure Laterality Date   WISDOM TOOTH EXTRACTION      Current Medications: Current Meds  Medication Sig   aspirin  81 MG chewable tablet Chew 1 tablet (81 mg total) by mouth daily.   atorvastatin  (LIPITOR) 80 MG tablet Take 1 tablet (80 mg total) by mouth daily for cholesterol.   cetirizine  (ZYRTEC ) 10 MG tablet Take 1 tablet (10 mg total) by mouth 2 (two) times daily.   Chlorphen-Phenyleph-ASA (ALKA-SELTZER PLUS COLD) 2-7.8-325 MG TBEF Take 1 tablet by mouth once.   DULoxetine  (CYMBALTA ) 30 MG capsule Take 1 capsule (30  mg total) by mouth daily for mood.   hydrOXYzine  (ATARAX ) 25 MG tablet Take 1-2 tablets (25-50 mg total) by mouth at night for sleep/anxiety.   metoprolol  succinate (TOPROL -XL) 25 MG 24 hr tablet Take 2 tablets (50 mg total) by mouth daily for blood pressure and heart.   nicotine  (NICOTINE  STEP 2) 14 mg/24hr  patch Place 1 patch (14 mg total) onto the skin daily.   nitroGLYCERIN  (NITROSTAT ) 0.4 MG SL tablet Place 1 tablet (0.4 mg total) under the tongue every 5 (five) minutes as needed for chest pain.   ticagrelor  (BRILINTA ) 90 MG TABS tablet Take 1 tablet (90 mg total) by mouth 2 (two) times daily.   [DISCONTINUED] lisinopril  (ZESTRIL ) 5 MG tablet Take 1 tablet (5 mg total) by mouth daily for heart and kidneys.     Allergies:   Patient has no known allergies.   Social History   Socioeconomic History   Marital status: Single    Spouse name: Not on file   Number of children: Not on file   Years of education: Not on file   Highest education level: Not on file  Occupational History   Not on file  Tobacco Use   Smoking status: Some Days    Current packs/day: 0.50    Types: Cigarettes   Smokeless tobacco: Never   Tobacco comments:    1-2 every 2 days   Substance and Sexual Activity   Alcohol use: Yes    Comment: occasional   Drug use: No    Frequency: 7.0 times per week    Types: Marijuana    Comment: none since 2012   Sexual activity: Not on file  Other Topics Concern   Not on file  Social History Narrative   Not on file   Social Drivers of Health   Financial Resource Strain: High Risk (02/28/2024)   Overall Financial Resource Strain (CARDIA)    Difficulty of Paying Living Expenses: Very hard  Food Insecurity: Food Insecurity Present (02/28/2024)   Hunger Vital Sign    Worried About Running Out of Food in the Last Year: Sometimes true    Ran Out of Food in the Last Year: Sometimes true  Transportation Needs: Unmet Transportation Needs (02/28/2024)   PRAPARE - Administrator, Civil Service (Medical): Yes    Lack of Transportation (Non-Medical): Yes  Physical Activity: Not on file  Stress: Not on file  Social Connections: Not on file     Family History: The patient's family history includes Liver disease in his mother.  ROS:   Please see the history of present  illness.     All other systems reviewed and are negative.  EKGs/Labs/Other Studies Reviewed:    EKG:  EKG is not ordered today.     Recent Labs: 12/09/2023: BUN 11; Creatinine, Ser 0.94; Hemoglobin 13.2; Platelets 203; Potassium 4.6; Sodium 140   Recent Lipid Panel No results found for: "CHOL", "TRIG", "HDL", "CHOLHDL", "VLDL", "LDLCALC", "LDLDIRECT"  Physical Exam:   VS:  BP 128/82   Pulse (!) 55   Ht 6\' 1"  (1.854 m)   Wt 177 lb (80.3 kg)   SpO2 99%   BMI 23.35 kg/m  , BMI Body mass index is 23.35 kg/m. GENERAL:  Well appearing HEENT: Pupils equal round and reactive, fundi not visualized, oral mucosa unremarkable NECK:  No jugular venous distention, waveform within normal limits, carotid upstroke brisk and symmetric, no bruits, no thyromegaly LUNGS:  Clear to auscultation bilaterally HEART:  RRR.  PMI not displaced or sustained,S1 and S2 within normal limits, no S3, no S4, no clicks, no rubs, no murmurs ABD:  Flat, positive bowel sounds normal in frequency in pitch, no bruits, no rebound, no guarding, no midline pulsatile mass, no hepatomegaly, no splenomegaly EXT:  2 plus pulses throughout, no edema, no cyanosis no clubbing SKIN:  No rashes no nodules NEURO:  Cranial nerves II through XII grossly intact, motor grossly intact throughout PSYCH:  Cognitively intact, oriented to person place and time   ASSESSMENT/PLAN:    Assessment and Plan Assessment & Plan # Chest pain Intermittent chest pain post-myocardial infarction, possibly stress or musculoskeletal-related. Normal heart function, no new blockages suspected. - Order exercise stress test to rule out cardiac causes. - Refer to orthopedics for musculoskeletal or nerve evaluation if stress is negative,, as his symptom seem more related to radiculopathy than ischemia.   # Myocardial infarction # Hyperlipidemia: Post-myocardial infarction with stent placement. Normal heart function, ejection fraction 55-60%. Continued  medication management necessary. - Continue aspirin , Brilinta , atorvastatin , and metoprolol . - Discontinue lisinopril  and monitor blood pressure. - Provide blood pressure cuff for home monitoring. - Evaluate blood pressure readings at follow-up.  # Numbness and tingling in left arm and leg Likely nerve-related, possibly due to previous neck injury or strain. - Refer to orthopedics for nerve evaluation.  # Hypertension Well-managed hypertension. Lisinopril  discontinued due to kidney pain. Home monitoring discussed. - Monitor blood pressure with home blood pressure cuff. - Re-evaluate need for antihypertensive medication at follow-up.  # Tobacco use Significant reduction in tobacco use. Smoking cessation encouraged to reduce cardiovascular risk. - Encourage continued reduction in tobacco use. - Discuss smoking cessation strategies.  # Homelessness: Patient referred to social work.  We also discussed the fact that his systolic function is normal and he shouldn't have any lasting limitations due to his prior infarct.  We need to focus on medical optimization and exercise to limited risk of recurrence.   Screening for Secondary Hypertension:     Relevant Labs/Studies:    Latest Ref Rng & Units 12/09/2023    4:09 PM 08/18/2016   10:53 PM 08/01/2014   10:00 AM  Basic Labs  Sodium 135 - 145 mmol/L 140  138  139   Potassium 3.5 - 5.1 mmol/L 4.6  3.7  4.2   Creatinine 0.61 - 1.24 mg/dL 1.61  0.96  0.45      Disposition:    FU with MD/PharmD in 6 weeks    Medication Adjustments/Labs and Tests Ordered: Current medicines are reviewed at length with the patient today.  Concerns regarding medicines are outlined above.  Orders Placed This Encounter  Procedures   AMB referral to orthopedics   Exercise Tolerance Test   No orders of the defined types were placed in this encounter.    Signed, Maudine Sos, MD  03/08/2024 2:16 PM    [facility]

## 2024-02-28 NOTE — Patient Instructions (Addendum)
 Medication Instructions:  Your physician recommends that you continue on your current medications as directed. Please refer to the Current Medication list given to you today.   Labwork: NONE  Testing/Procedures: Your physician has requested that you have an exercise tolerance test. For further information please visit [email or web address] Please also follow instruction sheet, as given. DO NOT TAKE YOUR METOPROLOL  MORNING OF TREADMILL   Follow-Up: 6 TO 8 WEEKS WITH CAITLIN W NP OR DR Culloden   Any Other Special Instructions Will Be Listed Below (If Applicable). STOP AT ORTHOPEDIC CHECK IN TO GET AN APPOINTMENT SCHEDULED FOR YOUR ARM   If you need a refill on your cardiac medications before your next appointment, please call your pharmacy.  Please arrive 15 minutes prior to your appointment time for registration and insurance purposes.  The test will take approximately 45 minutes to complete.  How to prepare for your Exercise Stress Test: Do bring a list of your current medications with you.  If not listed below, you may take your medications as normal. Do not take metoprolol  (Lopressor , Toprol ) for 24 hours prior to the test.  Bring the medication to your appointment as you may be required to take it once the test is complete. Do wear comfortable clothes (no dresses or overalls) and walking shoes, tennis shoes preferred (no heels or open toed shoes are allowed) Do Not wear cologne, perfume, aftershave or lotions (deodorant is allowed). Please report to [address] [facility], [address] for your test.  If these instructions are not followed, your test will have to be rescheduled.  If you have questions or concerns about your appointment, you can call the Stress Lab at [phone].  If you cannot keep your appointment, please provide 24 hours notification to the Stress Lab, to avoid a possible $50 charge to your account

## 2024-02-28 NOTE — Progress Notes (Signed)
 Heart and Vascular Care Navigation  02/28/2024  Evan Higgins 1981/09/10 [id number]  Reason for Referral: SDOH Screening Patient is participating in a Managed Medicaid Plan:No, [facility] Tailored Care  Engaged with patient face to face for initial visit for Heart and Vascular Care Coordination.                                                                                                   Assessment:                  LCSW met with pt today during visit. Introduced self, role, reason for visit. Pt confirmed address listed is still a good mailing address but he currently resides in a tent in [place]. Has support of family and friends and added his uncle Evan Higgins to emergency contacts but unable to stay with them at this time. He is currently unemployed- has applied for unemployment and disability and hasn't gotten either approved at this point. He does receive SNAP, has [employer] coverage for his medical bills and medications. Uses [facility] and family/friends for transportation. He shares Evan Higgins has been hard to connect with and the few times he tried to use [employer] transportation it was unreliable. He shares that he is mostly interested in food resources, getting connected with any community resources for housing/assistance. Also interested in dental resources as he has several teeth that are in need of either extraction or further care.   We discussed getting in touch with [employer] case workers and Advice worker, I will try and facilitate these referrals best I can. Understandably this has been more difficult given phone was disconnected but should be back on tonight per his report. LCSW explained role and how we can get him connected with additional resources as they come up. We discussed that disability is taking a long time to process but staying on top of referrals as best he can (even just once a month) may help him ensure he doesn't miss any mail etc.   Pt has been  extremely resourceful and encouraged him to continue to ask for assistance as needed- as we can only assist with the concerns that he shares with us . Referrals may be complicated by lack of income/employment but we will try our best to get him connected with community agencies.                      HRT/VAS Care Coordination     Patients Home Cardiology Office --  [facility]   Outpatient Care Team Social Worker   Social Worker Name: Nathen Balder, [place], [phone]   Living arrangements for the past 2 months No permanent address   Lives with: Self   Patient Current Insurance Coverage Medicaid   Patient Has Concern With Paying Medical Bills No   Does Patient Have Prescription Coverage? Yes       Social History:  SDOH Screenings   Food Insecurity: Food Insecurity Present (02/28/2024)  Housing: High Risk (02/28/2024)  Transportation Needs: Unmet Transportation Needs (02/28/2024)  Utilities: Patient Unable To Answer (02/28/2024)  Financial Resource Strain: High Risk (02/28/2024)  Tobacco Use: High Risk (02/28/2024)  Health Literacy: Adequate Health Literacy (02/28/2024)    SDOH Interventions: Financial Resources:  Financial Strain Interventions: Other (Comment) (unemployed- pending unemployment and disabilty; provided food resources, sent housing referral, recommended pt stay in contact best he is able to with social security each month for disability application) DSS for financial assistance and Tree surgeon for Medical illustrator Insecurity:  Food Insecurity Interventions: [employer] Provided, Other (Comment) (Food Pantry Items; Food Animal nutritionist; Music therapist; Pt receives [employer])  Housing Insecurity:  Housing Interventions: [employer] Provided, Other (Comment) (referral made to [employer], pt currently residing in tent)  Transportation:   Transportation Interventions:  Patient Resources (Friends/Family), Payor Benefit   Other Care Navigation Interventions:     Provided Pharmacy assistance resources  Pt utilizes [employer] at this time, able to get his medications, discussed letting care team know if he is unable to fill/afford any of his meds   Follow-up plan:   LCSW connected pt with food pantry items from [employer], a list of food resources and [employer] for hot meals, will make further referrals to [employer] and [employer] to see if we are able to get patient re-connected/connected to any of those resources. Provided list of dental resources and encouraged him to let me know if still unable to connect with that care. Pt phone will be reconnected this evening so I will call tomorrow and make sure he has my number saved as needed.

## 2024-02-29 ENCOUNTER — Encounter (HOSPITAL_COMMUNITY): Payer: Self-pay

## 2024-02-29 ENCOUNTER — Telehealth: Payer: Self-pay | Admitting: Licensed Clinical Social Worker

## 2024-02-29 NOTE — Telephone Encounter (Signed)
 H&V Care Navigation CSW Progress Note  Clinical Social Worker contacted patient by phone to f/u after appt. Was able to reach out to pt at [phone] today, phone back in service. Pt again gives verbal permission for referral to [employer] and [employer] Case Management. He asks when he gets assistance again from food bank at [facility]- explained that patients are able to access that during their appointments only at this time. Pt understands- texted him information about [employer] as sometimes their warehouse distributions are open to the public and it is near where he is located currently. He also was reminded about the [employer] which can assist him with locating a meal even during the weekends. We discussed how to use dental resource list from yesterday. Pt had questions about if he does need dental extractions etc how to go about that given his medications (his uncle encouraged him to ask)- shared that he should speak with dentist about that and then if any clearance needed or concerns to call clinic directly to ask nurse/provider. No additional questions at this time.  Referral made to [employer] team and was confirmed received by [facility], she shares that pt previously interacted with team in August 2024- they will reach back out to connect with him given transportation barriers.   I then also contacted [facility] [phone]) who provided me with pt case management group Lenise Quince and I was able to speak with Rollin Clock at [phone]. She took referral and will have one of their case management team members reach out to pt as well.   Remain available as needed. Will f/u in the coming weeks to assess any further needs.  Patient is participating in a Managed Medicaid Plan:  No, [employer] tailored care management  SDOH Screenings   Food Insecurity: Food Insecurity Present (02/28/2024)  Housing: High Risk (02/28/2024)  Transportation Needs:  Unmet Transportation Needs (02/28/2024)  Utilities: Patient Unable To Answer (02/28/2024)  Financial Resource Strain: High Risk (02/28/2024)  Tobacco Use: High Risk (02/28/2024)  Health Literacy: Adequate Health Literacy (02/28/2024)    Nathen Balder, MSW, LCSW Clinical Social Worker II [facility] Heart/Vascular Care Navigation  [phone]- work cell phone (preferred)

## 2024-03-04 ENCOUNTER — Ambulatory Visit (HOSPITAL_BASED_OUTPATIENT_CLINIC_OR_DEPARTMENT_OTHER): Payer: MEDICAID | Admitting: Student

## 2024-03-06 ENCOUNTER — Ambulatory Visit (HOSPITAL_COMMUNITY): Payer: MEDICAID

## 2024-03-07 ENCOUNTER — Ambulatory Visit (HOSPITAL_COMMUNITY): Payer: MEDICAID | Attending: Cardiovascular Disease

## 2024-03-07 ENCOUNTER — Telehealth: Payer: Self-pay | Admitting: Licensed Clinical Social Worker

## 2024-03-07 DIAGNOSIS — R079 Chest pain, unspecified: Secondary | ICD-10-CM | POA: Insufficient documentation

## 2024-03-07 LAB — EXERCISE TOLERANCE TEST
Angina Index: 0
Base ST Depression (mm): 0 mm
Duke Treadmill Score: 6
Estimated workload: 7.1
Exercise duration (min): 6 min
Exercise duration (sec): 7 s
MPHR: 177 {beats}/min
Peak HR: 96 {beats}/min
Percent HR: 54 %
Rest HR: 53 {beats}/min
ST Depression (mm): 0 mm

## 2024-03-07 NOTE — Telephone Encounter (Signed)
 H&V Care Navigation CSW Progress Note  Clinical Social Worker contacted patient by phone to f/u on resources given and referrals made. No answer this morning at [phone]. Left voicemail with updates that I know so far on referrals and encouraging him to monitor voicemails for any items that may be left for him from [employer] or [employer].  Patient is participating in a Managed Medicaid Plan:  No, [employer] Tailored Care  SDOH Screenings   Food Insecurity: Food Insecurity Present (02/28/2024)  Housing: High Risk (02/28/2024)  Transportation Needs: Unmet Transportation Needs (02/28/2024)  Utilities: Patient Unable To Answer (02/28/2024)  Financial Resource Strain: High Risk (02/28/2024)  Tobacco Use: High Risk (02/28/2024)  Health Literacy: Adequate Health Literacy (02/28/2024)     Nathen Balder, MSW, LCSW Clinical Social Worker II [facility] Health Heart/Vascular Care Navigation  [phone]- work cell phone (preferred)

## 2024-03-08 ENCOUNTER — Encounter (HOSPITAL_BASED_OUTPATIENT_CLINIC_OR_DEPARTMENT_OTHER): Payer: Self-pay | Admitting: Cardiovascular Disease

## 2024-03-08 DIAGNOSIS — I25811 Atherosclerosis of native coronary artery of transplanted heart without angina pectoris: Secondary | ICD-10-CM | POA: Insufficient documentation

## 2024-03-08 DIAGNOSIS — Z5900 Homelessness unspecified: Secondary | ICD-10-CM

## 2024-03-08 DIAGNOSIS — Z720 Tobacco use: Secondary | ICD-10-CM | POA: Insufficient documentation

## 2024-03-08 HISTORY — DX: Homelessness unspecified: Z59.00

## 2024-03-08 HISTORY — DX: Tobacco use: Z72.0

## 2024-03-08 HISTORY — DX: Atherosclerosis of native coronary artery of transplanted heart without angina pectoris: I25.811

## 2024-03-10 ENCOUNTER — Encounter (HOSPITAL_BASED_OUTPATIENT_CLINIC_OR_DEPARTMENT_OTHER): Payer: Self-pay | Admitting: *Deleted

## 2024-03-11 ENCOUNTER — Ambulatory Visit (HOSPITAL_BASED_OUTPATIENT_CLINIC_OR_DEPARTMENT_OTHER): Payer: Self-pay

## 2024-03-11 DIAGNOSIS — M79602 Pain in left arm: Secondary | ICD-10-CM

## 2024-03-11 DIAGNOSIS — R079 Chest pain, unspecified: Secondary | ICD-10-CM

## 2024-03-11 DIAGNOSIS — I25752 Atherosclerosis of native coronary artery of transplanted heart with refractory angina pectoris: Secondary | ICD-10-CM

## 2024-03-11 DIAGNOSIS — Z720 Tobacco use: Secondary | ICD-10-CM

## 2024-03-12 ENCOUNTER — Telehealth: Payer: Self-pay | Admitting: Licensed Clinical Social Worker

## 2024-03-12 NOTE — Telephone Encounter (Signed)
 H&V Care Navigation CSW Progress Note  Clinical Social Worker contacted patient by phone to f/u on patient assistance resources and to see if he has been contacted by [employer] through his [employer] or by [employer]. Left 2nd voicemail today with my call back number. Will re-attempt again next week if no return call.  Patient is participating in a Managed Medicaid Plan:  No, [employer] Tailored Care Plan  SDOH Screenings   Food Insecurity: Food Insecurity Present (02/28/2024)  Housing: High Risk (02/28/2024)  Transportation Needs: Unmet Transportation Needs (02/28/2024)  Utilities: Patient Unable To Answer (02/28/2024)  Financial Resource Strain: High Risk (02/28/2024)  Tobacco Use: High Risk (03/08/2024)  Health Literacy: Adequate Health Literacy (02/28/2024)    Nathen Balder, MSW, LCSW Clinical Social Worker II [facility] Health Heart/Vascular Care Navigation  [phone]- work cell phone (preferred)

## 2024-03-13 ENCOUNTER — Encounter (HOSPITAL_COMMUNITY): Payer: Self-pay

## 2024-03-13 ENCOUNTER — Other Ambulatory Visit (HOSPITAL_BASED_OUTPATIENT_CLINIC_OR_DEPARTMENT_OTHER): Payer: Self-pay | Admitting: Cardiovascular Disease

## 2024-03-13 DIAGNOSIS — R079 Chest pain, unspecified: Secondary | ICD-10-CM

## 2024-03-13 DIAGNOSIS — I25752 Atherosclerosis of native coronary artery of transplanted heart with refractory angina pectoris: Secondary | ICD-10-CM

## 2024-03-13 DIAGNOSIS — Z720 Tobacco use: Secondary | ICD-10-CM

## 2024-03-13 DIAGNOSIS — M79602 Pain in left arm: Secondary | ICD-10-CM

## 2024-03-13 NOTE — Addendum Note (Signed)
 Addended by: Guss Legacy on: 03/13/2024 09:25 AM   Modules accepted: Orders

## 2024-03-14 ENCOUNTER — Ambulatory Visit (HOSPITAL_COMMUNITY)
Admission: RE | Admit: 2024-03-14 | Discharge: 2024-03-14 | Disposition: A | Discharge status: Home / Self Care | Payer: MEDICAID | Source: Ambulatory Visit | Attending: Internal Medicine | Admitting: Internal Medicine

## 2024-03-14 DIAGNOSIS — M79602 Pain in left arm: Secondary | ICD-10-CM

## 2024-03-14 DIAGNOSIS — I25752 Atherosclerosis of native coronary artery of transplanted heart with refractory angina pectoris: Secondary | ICD-10-CM

## 2024-03-14 DIAGNOSIS — R079 Chest pain, unspecified: Secondary | ICD-10-CM

## 2024-03-14 DIAGNOSIS — Z720 Tobacco use: Secondary | ICD-10-CM

## 2024-03-18 ENCOUNTER — Telehealth: Payer: Self-pay | Admitting: Licensed Clinical Social Worker

## 2024-03-18 NOTE — Telephone Encounter (Signed)
 H&V Care Navigation CSW Progress Note  Clinical Social Worker contacted patient by phone to f/u on community resources. Was able to reach pt today at [phone]. He shares he was in touch with [employer] through [place] but not consistently able to reach them.   Has not heard from [employer] per his report but I shared that he may have had them leave a voicemail. Doesn't appear he has checked his voicemail as when asked if he received my messages he wasn't clear. Encouraged him to check his voicemail. Texted him the coordinated entry points in [place] this week and their number as well. No additional questions at this time. Unfortunately options for housing will be limited given no current income. Encouraged him to call me as needed.   Patient is participating in a Managed Medicaid Plan:  No, [employer] tailored care plan  SDOH Screenings   Food Insecurity: Food Insecurity Present (02/28/2024)  Housing: High Risk (02/28/2024)  Transportation Needs: Unmet Transportation Needs (02/28/2024)  Utilities: Patient Unable To Answer (02/28/2024)  Financial Resource Strain: High Risk (02/28/2024)  Tobacco Use: High Risk (03/08/2024)  Health Literacy: Adequate Health Literacy (02/28/2024)   Nathen Balder, MSW, LCSW Clinical Social Worker II [facility] Health Heart/Vascular Care Navigation  [phone]- work cell phone (preferred)

## 2024-03-21 ENCOUNTER — Ambulatory Visit (HOSPITAL_COMMUNITY)
Admission: RE | Admit: 2024-03-21 | Payer: MEDICAID | Source: Ambulatory Visit | Attending: Cardiovascular Disease | Admitting: Cardiovascular Disease

## 2024-03-21 ENCOUNTER — Encounter (HOSPITAL_COMMUNITY): Payer: Self-pay

## 2024-03-28 ENCOUNTER — Encounter (HOSPITAL_COMMUNITY): Payer: Self-pay | Admitting: Cardiovascular Disease

## 2024-04-01 ENCOUNTER — Telehealth: Payer: Self-pay | Admitting: Licensed Clinical Social Worker

## 2024-04-01 NOTE — Telephone Encounter (Signed)
 H&V Care Navigation CSW Progress Note  Clinical Social Worker contacted patient by phone to f/u on resources provided and missed appt for nuclear medicine testing. LCSW was able to reach him at [phone]. Pt shares he is currently in [place] , will be coming back to [place]  but isnt sure what date yet at this time. LCSW texted number to pt to reschedule appt when aware of date to return to  . He was encouraged to let me know when he returns should he need bus passes to get to appt due to challenges with [facility] transportation.  Patient is participating in a Managed Medicaid Plan:  No, [employer] tailored care  SDOH Screenings   Food Insecurity: Food Insecurity Present (02/28/2024)  Housing: High Risk (02/28/2024)  Transportation Needs: Unmet Transportation Needs (02/28/2024)  Utilities: Patient Unable To Answer (02/28/2024)  Financial Resource Strain: High Risk (02/28/2024)  Tobacco Use: High Risk (03/08/2024)  Health Literacy: Adequate Health Literacy (02/28/2024)   Nathen Balder, MSW, LCSW Clinical Social Worker II [facility] Heart/Vascular Care Navigation  [phone]- work cell phone (preferred)

## 2024-04-10 ENCOUNTER — Other Ambulatory Visit: Payer: Self-pay

## 2024-04-10 ENCOUNTER — Other Ambulatory Visit (HOSPITAL_COMMUNITY): Payer: Self-pay

## 2024-04-11 ENCOUNTER — Encounter (HOSPITAL_COMMUNITY): Payer: Self-pay

## 2024-04-11 ENCOUNTER — Other Ambulatory Visit (HOSPITAL_COMMUNITY): Payer: Self-pay

## 2024-04-17 ENCOUNTER — Encounter (HOSPITAL_BASED_OUTPATIENT_CLINIC_OR_DEPARTMENT_OTHER): Payer: Self-pay | Admitting: Family

## 2024-04-17 ENCOUNTER — Other Ambulatory Visit (HOSPITAL_COMMUNITY): Payer: Self-pay

## 2024-04-17 ENCOUNTER — Ambulatory Visit (HOSPITAL_BASED_OUTPATIENT_CLINIC_OR_DEPARTMENT_OTHER): Payer: MEDICAID | Admitting: Family

## 2024-04-17 VITALS — BP 100/74 | HR 56 | Ht 73.0 in | Wt 180.7 lb

## 2024-04-17 DIAGNOSIS — I10 Essential (primary) hypertension: Secondary | ICD-10-CM

## 2024-04-17 DIAGNOSIS — E785 Hyperlipidemia, unspecified: Secondary | ICD-10-CM | POA: Diagnosis not present

## 2024-04-17 DIAGNOSIS — I25118 Atherosclerotic heart disease of native coronary artery with other forms of angina pectoris: Secondary | ICD-10-CM | POA: Diagnosis not present

## 2024-04-17 MED ORDER — METOPROLOL SUCCINATE ER 25 MG PO TB24
50.0000 mg | ORAL_TABLET | Freq: Every day | ORAL | 3 refills | Status: DC
  Filled 2024-04-17: qty 180, 90d supply, fill #0

## 2024-04-17 MED ORDER — ATORVASTATIN CALCIUM 80 MG PO TABS
80.0000 mg | ORAL_TABLET | Freq: Every day | ORAL | 3 refills | Status: DC
  Filled 2024-04-17: qty 90, 90d supply, fill #0

## 2024-04-17 NOTE — Progress Notes (Unsigned)
 Advanced Hypertension Clinic Initial Assessment:    Date:  04/17/2024   ID:  Evan Higgins, DOB 1981-05-31, MRN [id number]  PCP:  Jori Small, FNP  Cardiologist:  None  Nephrologist:  Referring MD: Jori Small, FNP   CC: Hypertension  History of Present Illness:    Evan Higgins is a 43 y.o. male with a hx of *** here to establish care in the Advanced Hypertension Clinic.   Evan Higgins was diagnosed with hypertension ***. It has been {Blank single:19197::easy,difficult,***} to control. Blood pressure {Blank single:19197::not checked routinely at home,checked with arm cuff at home,checked with wrist cuff at home,***}. Readings have been ***. he reports tobacco use ***. Alcohol use ***. For exercise he ***. he eats {Blank multiple:19196::***,at home,outside of the home} and {Blank single:19197::does,does not} follow low sodium diet.   Going to [place], [place]  in 2 days and will bet here for 2 weeks.  LEft arm numbness when he is laying on his left arm and goes numb and left hand will lock up doing noting longest 45 seconds to 1 minute. Also has sporadic left sided chest pain. No clear aggravating nor relieving factors. Rerpots tingling feeling down his hand  He is no longer taking Cymbalta , Neurontin , nor Hydroxyzine . FElts he remembered too [facility] and it made difficult to sleep.   [place]  recently?  Previous antihypertensives:  Secondary Causes of Hypertension  Medications/Herbal: OCP, steroids, stimulants, antidepressants, weight loss medication, immune suppressants, NSAIDs, sympathomimetics, alcohol, caffeine, licorice, ginseng, St. John's wort, chemo  Sleep Apnea Renal artery stenosis Hyperaldosteronism Hyper/hypothyroidism Pheochromocytoma: palpitations, tachycardia, headache, diaphoresis (plasma metanephrines) Cushing's syndrome: Cushingoid facies, central obesity, proximal muscle weakness, and ecchymoses,  adrenal incidentaloma (cortisol) Coarctation of the aorta  Past Medical History:  Diagnosis Date   CAD (coronary artery disease), native artery transplanted heart 03/08/2024   Gout    Homelessness 03/08/2024   Tobacco abuse 03/08/2024    Past Surgical History:  Procedure Laterality Date   WISDOM TOOTH EXTRACTION      Current Medications: Current Meds  Medication Sig   aspirin  81 MG chewable tablet Chew 1 tablet (81 mg total) by mouth daily.   atorvastatin  (LIPITOR) 80 MG tablet Take 1 tablet (80 mg total) by mouth daily for cholesterol.   metoprolol  succinate (TOPROL -XL) 25 MG 24 hr tablet Take 2 tablets (50 mg total) by mouth daily for blood pressure and heart.   nitroGLYCERIN  (NITROSTAT ) 0.4 MG SL tablet Place 1 tablet (0.4 mg total) under the tongue every 5 (five) minutes as needed for chest pain.   nystatin  (MYCOSTATIN /NYSTOP ) powder Apply to affected area 1-2 times daily (Patient taking differently: Apply 1 Application topically as needed.)   ticagrelor  (BRILINTA ) 90 MG TABS tablet Take 1 tablet (90 mg total) by mouth 2 (two) times daily.     Allergies:   Patient has no known allergies.   Social History   Socioeconomic History   Marital status: Single    Spouse name: Not on file   Number of children: Not on file   Years of education: Not on file   Highest education level: Not on file  Occupational History   Not on file  Tobacco Use   Smoking status: Some Days    Current packs/day: 0.50    Types: Cigarettes   Smokeless tobacco: Never   Tobacco comments:    1-2 every 2 days   Substance and Sexual Activity   Alcohol use: Yes    Comment: occasional   Drug use: No    Frequency:  7.0 times per week    Types: Marijuana    Comment: none since 2012   Sexual activity: Not on file  Other Topics Concern   Not on file  Social History Narrative   Not on file   Social Drivers of Health   Financial Resource Strain: High Risk (02/28/2024)   Overall Financial Resource  Strain (CARDIA)    Difficulty of Paying Living Expenses: Very hard  Food Insecurity: Food Insecurity Present (02/28/2024)   Hunger Vital Sign    Worried About Running Out of Food in the Last Year: Sometimes true    Ran Out of Food in the Last Year: Sometimes true  Transportation Needs: Unmet Transportation Needs (02/28/2024)   PRAPARE - Administrator, Civil Service (Medical): Yes    Lack of Transportation (Non-Medical): Yes  Physical Activity: Not on file  Stress: Not on file  Social Connections: Not on file     Family History: The patient's ***family history includes Liver disease in his mother.  ROS:   Please see the history of present illness.    *** All other systems reviewed and are negative.  EKGs/Labs/Other Studies Reviewed:         Recent Labs: 12/09/2023: BUN 11; Creatinine, Ser 0.94; Hemoglobin 13.2; Platelets 203; Potassium 4.6; Sodium 140   Recent Lipid Panel No results found for: CHOL, TRIG, HDL, CHOLHDL, VLDL, LDLCALC, LDLDIRECT  Physical Exam:   VS:  BP 100/74   Pulse (!) 56   Ht 6' 1 (1.854 m)   Wt 180 lb 11.2 oz (82 kg)   SpO2 97%   BMI 23.84 kg/m  , BMI Body mass index is 23.84 kg/m. GENERAL:  Well appearing HEENT: Pupils equal round and reactive, fundi not visualized, oral mucosa unremarkable NECK:  No jugular venous distention, waveform within normal limits, carotid upstroke brisk and symmetric, no bruits, no thyromegaly LYMPHATICS:  No cervical adenopathy LUNGS:  Clear to auscultation bilaterally HEART:  RRR.  PMI not displaced or sustained,S1 and S2 within normal limits, no S3, no S4, no clicks, no rubs, *** murmurs ABD:  Flat, positive bowel sounds normal in frequency in pitch, no bruits, no rebound, no guarding, no midline pulsatile mass, no hepatomegaly, no splenomegaly EXT:  2 plus pulses throughout, no edema, no cyanosis no clubbing SKIN:  No rashes no nodules NEURO:  Cranial nerves II through XII grossly intact, motor  grossly intact throughout PSYCH:  Cognitively intact, oriented to person place and time   ASSESSMENT/PLAN:    HTN -    Screening for Secondary Hypertension: { Click here to document screening for secondary causes of HTN  :[id number]}    Relevant Labs/Studies:    Latest Ref Rng & Units 12/09/2023    4:09 PM 08/18/2016   10:53 PM 08/01/2014   10:00 AM  Basic Labs  Sodium 135 - 145 mmol/L 140  138  139   Potassium 3.5 - 5.1 mmol/L 4.6  3.7  4.2   Creatinine 0.61 - 1.24 mg/dL 9.05  9.09  8.89                     he *** interested in enrolling in the PREP exercise and nutrition program through the [facility].     Disposition:    FU with MD/APP/PharmD in {gen number 9-89:689602} {Days to years:10300}    Medication Adjustments/Labs and Tests Ordered: Current medicines are reviewed at length with the patient today.  Concerns regarding medicines are outlined above.  No  orders of the defined types were placed in this encounter.  No orders of the defined types were placed in this encounter.    Signed, Reche GORMAN Finder, NP  04/17/2024 3:04 PM    [facility]

## 2024-04-17 NOTE — Patient Instructions (Addendum)
 Medication Instructions:  Can use hydrocortisone ointment or Benadryl  ointment on your previous bug bites  Can also use Zyrtec  once per day for itching    Testing/Procedures: Recommend scheduling your stress test as previously ordered.   Follow-Up: Please follow up in 3 months in ADV HTN CLINIC with Dr. Theodis Fiscal, Neomi Banks, NP or Donivan Furry PharmD    Special Instructions:    Your arm and hand pain may be related to a nerve or shoulder issue, recommend scheduling visit with orthopedics when you get back from being out of town.   Heart Healthy Diet Recommendations: A low-salt diet is recommended. Meats should be grilled, baked, or boiled. Avoid fried foods. Focus on lean protein sources like fish or chicken with vegetables and fruits. The American Heart Association is a Chief Technology Officer!  American Heart Association Diet and Lifeystyle Recommendations    Exercise recommendations: The American Heart Association recommends 150 minutes of moderate intensity exercise weekly. Try 30 minutes of moderate intensity exercise 4-5 times per week. This could include walking, jogging, or swimming.   _______________________________  Please arrive 15 minutes prior to your appointment time for registration and insurance purposes.   The test will take approximately 45 minutes to complete.   How to prepare for your Exercise Stress Test: Do bring a list of your current medications with you.  If not listed below, you may take your medications as normal. Do not take metoprolol  (Lopressor , Toprol ) for 24 hours prior to the test.  Bring the medication to your appointment as you may be required to take it once the test is complete. Do wear comfortable clothes (no dresses or overalls) and walking shoes, tennis shoes preferred (no heels or open toed shoes are allowed) Do Not wear cologne, perfume, aftershave or lotions (deodorant is allowed). Please report to [address] [facility], [address] for your  test.   If these instructions are not followed, your test will have to be rescheduled.   If you have questions or concerns about your appointment, you can call the Stress Lab at [phone].   If you cannot keep your appointment, please provide 24 hours notification to the Stress Lab, to avoid a possible $50 charge to your account

## 2024-04-23 ENCOUNTER — Other Ambulatory Visit (HOSPITAL_COMMUNITY): Payer: Self-pay

## 2024-04-23 MED ORDER — ATORVASTATIN CALCIUM 80 MG PO TABS
80.0000 mg | ORAL_TABLET | Freq: Every day | ORAL | 3 refills | Status: AC
  Filled 2024-04-23 – 2024-05-13 (×2): qty 90, 90d supply, fill #0
  Filled 2024-10-13: qty 90, 90d supply, fill #1

## 2024-05-05 ENCOUNTER — Other Ambulatory Visit (HOSPITAL_COMMUNITY): Payer: Self-pay

## 2024-05-05 ENCOUNTER — Telehealth (HOSPITAL_COMMUNITY): Payer: Self-pay

## 2024-05-05 NOTE — Telephone Encounter (Signed)
 Detailed instructions left on the patient's answering machine. Evan Higgins CCT

## 2024-05-09 ENCOUNTER — Ambulatory Visit (HOSPITAL_COMMUNITY)
Admission: RE | Admit: 2024-05-09 | Discharge: 2024-05-09 | Disposition: A | Discharge status: Home / Self Care | Payer: MEDICAID | Source: Ambulatory Visit | Attending: Cardiovascular Disease

## 2024-05-09 DIAGNOSIS — R079 Chest pain, unspecified: Secondary | ICD-10-CM | POA: Diagnosis present

## 2024-05-09 DIAGNOSIS — I25752 Atherosclerosis of native coronary artery of transplanted heart with refractory angina pectoris: Secondary | ICD-10-CM | POA: Diagnosis present

## 2024-05-09 DIAGNOSIS — Z720 Tobacco use: Secondary | ICD-10-CM | POA: Diagnosis present

## 2024-05-09 DIAGNOSIS — M79602 Pain in left arm: Secondary | ICD-10-CM | POA: Diagnosis present

## 2024-05-09 LAB — MYOCARDIAL PERFUSION IMAGING
LV dias vol: 160 mL (ref 62–150)
LV sys vol: 94 mL (ref 4.2–5.8)
Nuc Stress EF: 60 %
Peak HR: 98 {beats}/min
Rest HR: 47 {beats}/min
Rest Nuclear Isotope Dose: 10.2 mCi
SDS: 4
SRS: 8
SSS: 12
Stress Nuclear Isotope Dose: 31.5 mCi
TID: 1.13

## 2024-05-09 MED ORDER — TECHNETIUM TC 99M TETROFOSMIN IV KIT
31.5000 | PACK | Freq: Once | INTRAVENOUS | Status: AC | PRN
  Administered 2024-05-09: 31.5 via INTRAVENOUS

## 2024-05-09 MED ORDER — REGADENOSON 0.4 MG/5ML IV SOLN
INTRAVENOUS | Status: AC
  Filled 2024-05-09: qty 5

## 2024-05-09 MED ORDER — TECHNETIUM TC 99M TETROFOSMIN IV KIT
10.2000 | PACK | Freq: Once | INTRAVENOUS | Status: AC | PRN
  Administered 2024-05-09: 10.2 via INTRAVENOUS

## 2024-05-09 MED ORDER — REGADENOSON 0.4 MG/5ML IV SOLN
0.4000 mg | Freq: Once | INTRAVENOUS | Status: AC
  Administered 2024-05-09: 0.4 mg via INTRAVENOUS

## 2024-05-12 ENCOUNTER — Ambulatory Visit: Payer: Self-pay | Admitting: Cardiovascular Disease

## 2024-05-13 ENCOUNTER — Other Ambulatory Visit (HOSPITAL_COMMUNITY): Payer: Self-pay

## 2024-05-13 MED ORDER — ISOSORBIDE MONONITRATE ER 30 MG PO TB24
30.0000 mg | ORAL_TABLET | Freq: Every day | ORAL | 3 refills | Status: DC
  Filled 2024-05-13: qty 90, 90d supply, fill #0

## 2024-05-14 ENCOUNTER — Other Ambulatory Visit: Payer: Self-pay

## 2024-05-26 ENCOUNTER — Other Ambulatory Visit (HOSPITAL_COMMUNITY): Payer: Self-pay

## 2024-05-29 ENCOUNTER — Other Ambulatory Visit: Payer: Self-pay

## 2024-05-29 ENCOUNTER — Emergency Department (HOSPITAL_COMMUNITY)
Admission: EM | Admit: 2024-05-29 | Discharge: 2024-05-29 | Disposition: A | Discharge status: Home / Self Care | Payer: MEDICAID | Attending: Emergency Medicine | Admitting: Emergency Medicine

## 2024-05-29 ENCOUNTER — Emergency Department (HOSPITAL_COMMUNITY): Payer: MEDICAID

## 2024-05-29 ENCOUNTER — Encounter (HOSPITAL_COMMUNITY): Payer: Self-pay

## 2024-05-29 DIAGNOSIS — R072 Precordial pain: Secondary | ICD-10-CM | POA: Diagnosis present

## 2024-05-29 DIAGNOSIS — I2510 Atherosclerotic heart disease of native coronary artery without angina pectoris: Secondary | ICD-10-CM | POA: Diagnosis not present

## 2024-05-29 DIAGNOSIS — R079 Chest pain, unspecified: Secondary | ICD-10-CM

## 2024-05-29 DIAGNOSIS — Z5900 Homelessness unspecified: Secondary | ICD-10-CM | POA: Insufficient documentation

## 2024-05-29 LAB — CBC
HCT: 39.7 % (ref 39.0–52.0)
Hemoglobin: 12.9 g/dL — ABNORMAL LOW (ref 13.0–17.0)
MCH: 31.9 pg (ref 26.0–34.0)
MCHC: 32.5 g/dL (ref 30.0–36.0)
MCV: 98.3 fL (ref 80.0–100.0)
Platelets: 186 K/uL (ref 150–400)
RBC: 4.04 MIL/uL — ABNORMAL LOW (ref 4.22–5.81)
RDW: 11.7 % (ref 11.5–15.5)
WBC: 4.2 K/uL (ref 4.0–10.5)
nRBC: 0 % (ref 0.0–0.2)

## 2024-05-29 LAB — BASIC METABOLIC PANEL WITH GFR
Anion gap: 7 (ref 5–15)
BUN: 6 mg/dL (ref 6–20)
CO2: 26 mmol/L (ref 22–32)
Calcium: 9 mg/dL (ref 8.9–10.3)
Chloride: 107 mmol/L (ref 98–111)
Creatinine, Ser: 0.93 mg/dL (ref 0.61–1.24)
GFR, Estimated: >60 mL/min (ref >60)
Glucose, Bld: 91 mg/dL (ref 70–99)
Potassium: 3.6 mmol/L (ref 3.5–5.1)
Sodium: 140 mmol/L (ref 135–145)

## 2024-05-29 LAB — TROPONIN I (HIGH SENSITIVITY)
Troponin I (High Sensitivity): 6 ng/L (ref <18)
Troponin I (High Sensitivity): 6 ng/L (ref <18)

## 2024-05-29 NOTE — ED Provider Triage Note (Signed)
 Emergency Medicine Provider Triage Evaluation Note  Kendale Rembold , a 43 y.o. male  was evaluated in triage.  Pt complains of chest pain, rating down left arm that has been intermittent since his stent placement done in October of last year.  Worse today, stating that it feels similar to the previous time that he had had a heart attack.  Notes that he has not been having shortness of breath but has felt some lightheadedness.  Denies blurry vision or vertigo.  States that this happens particularly after his walks.  Symptoms are not exertional.  Denies fever, headache, vision changes, abdominal pain, lower leg swelling.  Review of Systems  Positive: N/a Negative: N/a  Physical Exam  BP 122/84 (BP Location: Left Arm)   Pulse (!) 54   Temp 98.2 F (36.8 C)   Resp 16   SpO2 100%  Gen:   Awake, no distress   Resp:  Normal effort  MSK:   Moves extremities without difficulty  Other:    Medical Decision Making  Medically screening exam initiated at 3:57 PM.  Appropriate orders placed.  Byrne Capek was informed that the remainder of the evaluation will be completed by another provider, this initial triage assessment does not replace that evaluation, and the importance of remaining in the ED until their evaluation is complete.     Beola Terrall RAMAN, [place] 05/29/24 1558

## 2024-05-29 NOTE — ED Provider Notes (Signed)
 [facility] Provider Note  MDM   HPI/ROS:  Evan Higgins is a 43 y.o. male with a medical history as below who presents with 10 months of intermittent exertional substernal chest pain.  Patient reports that his pain is not acutely worsened however he is concerned that it persist despite having PCI in October.  He also states he has been unable to work due to this.  He denies any shortness of breath or swelling in his legs.  He does endorse some left-sided weakness that has been persistent since October and is not worsening.  He denies any aphasia or slurred speech.  Physical exam is notable for: - Benign neurologic exam, no rales or lower extremity edema.  No murmurs rubs or muffled heart sounds  On my initial evaluation, patient is:  -Vital signs stable. Patient afebrile, hemodynamically stable, and non-toxic appearing.  Differentials include but not limited to ACS, PE, pericarditis, myocarditis, pneumonia, dissection, costochondritis, CVA.    Physical exam is reassuring as benign neurologic exam.  He is overall generally very well-appearing, no diaphoresis, nausea or vomiting.  Hear score 2.  His laboratory workup and chest x-ray as below is very reassuring.  Given his subjective weakness we will obtain CT head which is also unremarkable as below.  As he did have stents placed 8 months ago we will refer him to cardiology for closer follow-up.  He should follow-up with his PCP regarding his left-sided weakness as he may require physical therapy.  I discussed with the patient at bedside who states understanding is in agreement with the plan.  He was discharged in stable condition with strict return precautions.  Interpretations, interventions, and the patient's course of care are documented below.    Clinical Course as of 05/29/24 2210  Thu May 29, 2024  2009 Troponin I (High Sensitivity): 6 [RC]  2009 Hemoglobin(!): 12.9 CBC with mild anemia  but no leukocytosis [RC]  2010 BMP without gross electrolyte abnormalities.  Renal function appropriate [RC]  2010 DG Chest 2 View No pneumonia, pneumothorax or widened mediastinum [RC]  2030 EKG 12-Lead Rate 60, mild bradycardia with normal axis and intervals.  No STE or depression.  Nonischemic EKG [RC]  2121 Troponin I (High Sensitivity): 6 [RC]  2209 CT Head Wo Contrast No intracranial abnormality [RC]    Clinical Course User Index [RC] Sharyne Darina RAMAN, MD      Disposition:  I discussed the plan for discharge with the patient and/or their surrogate at bedside prior to discharge and they were in agreement with the plan and verbalized understanding of the return precautions provided. All questions answered to the best of my ability. Ultimately, the patient was discharged in stable condition with stable vital signs. I am reassured that they are capable of close follow up and good social support at home.   Clinical Impression:  1. Chest pain, unspecified type     Rx / DC Orders ED Discharge Orders          Ordered    Ambulatory referral to Cardiology        05/29/24 2121            The plan for this patient was discussed with Dr. Doretha, who voiced agreement and who oversaw evaluation and treatment of this patient.   Clinical Complexity A medically appropriate history, review of systems, and physical exam was performed.  My independent interpretations of EKG, labs, and radiology are documented in the ED  course above.   If decision rules were used in this patient's evaluation, they are listed below.  Heart score Click here for ABCD2, HEART and other calculatorsREFRESH Note before signing   Patient's presentation is most consistent with acute presentation with potential threat to life or bodily function.  Medical Decision Making Amount and/or Complexity of Data Reviewed Labs: ordered. Decision-making details documented in ED Course. Radiology: ordered.  Decision-making details documented in ED Course. ECG/medicine tests:  Decision-making details documented in ED Course.    HPI/ROS      See MDM section for pertinent HPI and ROS. A complete ROS was performed with pertinent positives/negatives noted above.   Past Medical History:  Diagnosis Date   CAD (coronary artery disease), native artery transplanted heart 03/08/2024   Gout    Homelessness 03/08/2024   Tobacco abuse 03/08/2024    Past Surgical History:  Procedure Laterality Date   WISDOM TOOTH EXTRACTION        Physical Exam   Vitals:   05/29/24 1508 05/29/24 1930 05/29/24 1941 05/29/24 1948  BP: 122/84 (!) 132/92    Pulse:  (!) 53    Resp:  18    Temp:   98.1 F (36.7 C)   TempSrc:   Oral   SpO2:  100%  100%    Physical Exam Vitals and nursing note reviewed.  Constitutional:      General: He is not in acute distress.    Appearance: He is well-developed.  HENT:     Head: Normocephalic and atraumatic.  Eyes:     Conjunctiva/sclera: Conjunctivae normal.  Cardiovascular:     Rate and Rhythm: Normal rate and regular rhythm.     Heart sounds: No murmur heard. Pulmonary:     Effort: Pulmonary effort is normal. No respiratory distress.     Breath sounds: Normal breath sounds.  Abdominal:     Palpations: Abdomen is soft.     Tenderness: There is no abdominal tenderness.  Musculoskeletal:        General: No swelling.     Cervical back: Neck supple.  Skin:    General: Skin is warm and dry.     Capillary Refill: Capillary refill takes less than 2 seconds.  Neurological:     Mental Status: He is alert.     GCS: GCS eye subscore is 4. GCS verbal subscore is 5. GCS motor subscore is 6.     Cranial Nerves: Cranial nerves 2-12 are intact.     Sensory: Sensation is intact.     Motor: Motor function is intact. No weakness.     Coordination: Coordination is intact.  Psychiatric:        Mood and Affect: Mood normal.      Procedures   If procedures were  preformed on this patient, they are listed below:  Procedures   @BBSIG @   Please note that this documentation was produced with the assistance of voice-to-text technology and may contain errors.    Sharyne Darina RAMAN, MD 05/29/24 7787    Doretha Folks, MD 05/29/24 2223

## 2024-05-29 NOTE — ED Triage Notes (Signed)
 [facility] reports pt coming from home. Pt c/o chest pain that started three days ago as well as chest pain. Also c/o headache as well. Pt has stent placed in October.

## 2024-05-29 NOTE — Discharge Instructions (Addendum)
 You were seen today for chest pain. While you were here we monitored your vitals, preformed a physical exam, and labs and chest x-ray. These were all reassuring and there is no indication for any further testing or intervention in the emergency department at this time.   Things to do:  - Follow up with your primary care provider within the next 1-2 weeks regarding your left-sided weakness.  They may want to schedule an MRI - Please follow-up with cardiology, they will call you to schedule follow-up.   Return to the emergency department if you have any new or worsening symptoms including worsening chest pain, difficulty breathing, swelling in your legs, confusion or if you have any other concerns.

## 2024-07-16 ENCOUNTER — Encounter (HOSPITAL_BASED_OUTPATIENT_CLINIC_OR_DEPARTMENT_OTHER): Payer: Self-pay

## 2024-07-17 ENCOUNTER — Encounter (HOSPITAL_BASED_OUTPATIENT_CLINIC_OR_DEPARTMENT_OTHER): Payer: MEDICAID | Admitting: Family

## 2024-08-28 ENCOUNTER — Encounter (HOSPITAL_COMMUNITY): Payer: Self-pay | Admitting: Emergency Medicine

## 2024-08-28 ENCOUNTER — Emergency Department (HOSPITAL_COMMUNITY)
Admission: EM | Admit: 2024-08-28 | Discharge: 2024-08-28 | Disposition: A | Discharge status: Other Acute Inpatient Hospital | Payer: MEDICAID | Attending: Emergency Medicine | Admitting: Emergency Medicine

## 2024-08-28 ENCOUNTER — Other Ambulatory Visit: Payer: Self-pay

## 2024-08-28 ENCOUNTER — Emergency Department (HOSPITAL_COMMUNITY): Payer: MEDICAID

## 2024-08-28 ENCOUNTER — Encounter: Payer: Self-pay | Admitting: Internal Medicine

## 2024-08-28 ENCOUNTER — Encounter (HOSPITAL_COMMUNITY): Payer: Self-pay

## 2024-08-28 ENCOUNTER — Observation Stay
Admission: AD | Admit: 2024-08-28 | Discharge: 2024-08-29 | Disposition: A | Discharge status: Home / Self Care | Payer: MEDICAID | Source: Other Acute Inpatient Hospital

## 2024-08-28 DIAGNOSIS — R29898 Other symptoms and signs involving the musculoskeletal system: Secondary | ICD-10-CM | POA: Diagnosis not present

## 2024-08-28 DIAGNOSIS — R112 Nausea with vomiting, unspecified: Secondary | ICD-10-CM | POA: Insufficient documentation

## 2024-08-28 DIAGNOSIS — M4803 Spinal stenosis, cervicothoracic region: Secondary | ICD-10-CM | POA: Insufficient documentation

## 2024-08-28 DIAGNOSIS — Z79899 Other long term (current) drug therapy: Secondary | ICD-10-CM | POA: Diagnosis not present

## 2024-08-28 DIAGNOSIS — I2510 Atherosclerotic heart disease of native coronary artery without angina pectoris: Secondary | ICD-10-CM | POA: Diagnosis present

## 2024-08-28 DIAGNOSIS — Z7982 Long term (current) use of aspirin: Secondary | ICD-10-CM | POA: Diagnosis not present

## 2024-08-28 DIAGNOSIS — I214 Non-ST elevation (NSTEMI) myocardial infarction: Secondary | ICD-10-CM | POA: Diagnosis present

## 2024-08-28 DIAGNOSIS — R072 Precordial pain: Secondary | ICD-10-CM | POA: Insufficient documentation

## 2024-08-28 DIAGNOSIS — R079 Chest pain, unspecified: Secondary | ICD-10-CM | POA: Diagnosis present

## 2024-08-28 DIAGNOSIS — F17210 Nicotine dependence, cigarettes, uncomplicated: Secondary | ICD-10-CM | POA: Diagnosis not present

## 2024-08-28 DIAGNOSIS — E785 Hyperlipidemia, unspecified: Secondary | ICD-10-CM | POA: Diagnosis not present

## 2024-08-28 DIAGNOSIS — Z743 Need for continuous supervision: Secondary | ICD-10-CM | POA: Diagnosis not present

## 2024-08-28 DIAGNOSIS — I200 Unstable angina: Secondary | ICD-10-CM

## 2024-08-28 DIAGNOSIS — I25110 Atherosclerotic heart disease of native coronary artery with unstable angina pectoris: Secondary | ICD-10-CM | POA: Diagnosis not present

## 2024-08-28 DIAGNOSIS — I959 Hypotension, unspecified: Secondary | ICD-10-CM | POA: Diagnosis not present

## 2024-08-28 DIAGNOSIS — R0602 Shortness of breath: Secondary | ICD-10-CM | POA: Diagnosis not present

## 2024-08-28 DIAGNOSIS — Z5900 Homelessness unspecified: Secondary | ICD-10-CM | POA: Diagnosis not present

## 2024-08-28 DIAGNOSIS — M79602 Pain in left arm: Secondary | ICD-10-CM | POA: Insufficient documentation

## 2024-08-28 DIAGNOSIS — Z720 Tobacco use: Secondary | ICD-10-CM | POA: Diagnosis present

## 2024-08-28 HISTORY — DX: Acute myocardial infarction, unspecified: I21.9

## 2024-08-28 HISTORY — DX: Hyperlipidemia, unspecified: E78.5

## 2024-08-28 LAB — TROPONIN I (HIGH SENSITIVITY)
Troponin I (High Sensitivity): 11 ng/L (ref <18)
Troponin I (High Sensitivity): 29 ng/L — ABNORMAL HIGH (ref <18)
Troponin I (High Sensitivity): 26 ng/L — ABNORMAL HIGH (ref <18)
Troponin I (High Sensitivity): 25 ng/L — ABNORMAL HIGH (ref <18)

## 2024-08-28 LAB — CBC
HCT: 42.8 % (ref 39.0–52.0)
Hemoglobin: 13.8 g/dL (ref 13.0–17.0)
MCH: 32.3 pg (ref 26.0–34.0)
MCHC: 32.2 g/dL (ref 30.0–36.0)
MCV: 100.2 fL — ABNORMAL HIGH (ref 80.0–100.0)
Platelets: 174 K/uL (ref 150–400)
RBC: 4.27 MIL/uL (ref 4.22–5.81)
RDW: 12 % (ref 11.5–15.5)
WBC: 6.7 K/uL (ref 4.0–10.5)
nRBC: 0 % (ref 0.0–0.2)

## 2024-08-28 LAB — BASIC METABOLIC PANEL WITH GFR
Anion gap: 9 (ref 5–15)
BUN: 12 mg/dL (ref 6–20)
CO2: 22 mmol/L (ref 22–32)
Calcium: 8.5 mg/dL — ABNORMAL LOW (ref 8.9–10.3)
Chloride: 106 mmol/L (ref 98–111)
Creatinine, Ser: 0.77 mg/dL (ref 0.61–1.24)
GFR, Estimated: >60 mL/min (ref >60)
Glucose, Bld: 85 mg/dL (ref 70–99)
Potassium: 4.2 mmol/L (ref 3.5–5.1)
Sodium: 137 mmol/L (ref 135–145)

## 2024-08-28 LAB — PROTIME-INR
INR: 1 (ref 0.8–1.2)
Prothrombin Time: 13.8 s (ref 11.4–15.2)

## 2024-08-28 LAB — APTT: aPTT: 31 s (ref 24–36)

## 2024-08-28 MED ORDER — ACETAMINOPHEN 325 MG PO TABS: 650.0000 mg | ORAL_TABLET | Freq: Four times a day (QID) | ORAL | Status: DC | PRN

## 2024-08-28 MED ORDER — HYDRALAZINE HCL 20 MG/ML IJ SOLN: 5.0000 mg | INTRAMUSCULAR | Status: DC | PRN

## 2024-08-28 MED ORDER — NITROGLYCERIN 0.4 MG SL SUBL: 0.4000 mg | SUBLINGUAL_TABLET | SUBLINGUAL | Status: DC | PRN

## 2024-08-28 MED ORDER — TICAGRELOR 90 MG PO TABS
90.0000 mg | ORAL_TABLET | Freq: Two times a day (BID) | ORAL | Status: DC
  Administered 2024-08-28: 90 mg via ORAL
  Filled 2024-08-28: qty 1

## 2024-08-28 MED ORDER — LISINOPRIL 5 MG PO TABS: 5.0000 mg | ORAL_TABLET | Freq: Every day | ORAL | Status: DC

## 2024-08-28 MED ORDER — ASPIRIN 81 MG PO CHEW
81.0000 mg | CHEWABLE_TABLET | Freq: Every day | ORAL | Status: DC
  Administered 2024-08-29: 81 mg via ORAL
  Filled 2024-08-28: qty 1

## 2024-08-28 MED ORDER — ISOSORBIDE MONONITRATE ER 30 MG PO TB24: 30.0000 mg | ORAL_TABLET | Freq: Every day | ORAL | Status: DC

## 2024-08-28 MED ORDER — ONDANSETRON HCL 4 MG/2ML IJ SOLN: 4.0000 mg | Freq: Three times a day (TID) | INTRAMUSCULAR | Status: DC | PRN

## 2024-08-28 MED ORDER — ATORVASTATIN CALCIUM 80 MG PO TABS: 80.0000 mg | ORAL_TABLET | Freq: Every day | ORAL | Status: DC

## 2024-08-28 MED ORDER — NICOTINE 21 MG/24HR TD PT24
21.0000 mg | MEDICATED_PATCH | Freq: Every day | TRANSDERMAL | Status: DC
  Administered 2024-08-28: 21 mg via TRANSDERMAL
  Filled 2024-08-28: qty 1

## 2024-08-28 MED ORDER — MORPHINE SULFATE (PF) 2 MG/ML IV SOLN: 2.0000 mg | INTRAVENOUS | Status: DC | PRN

## 2024-08-28 MED ORDER — HEPARIN SODIUM (PORCINE) 5000 UNIT/ML IJ SOLN
5000.0000 [IU] | Freq: Three times a day (TID) | INTRAMUSCULAR | Status: DC
  Administered 2024-08-28 – 2024-08-29 (×2): 5000 [IU] via SUBCUTANEOUS
  Filled 2024-08-28 (×2): qty 1

## 2024-08-28 NOTE — ED Provider Notes (Signed)
 [place] EMERGENCY DEPARTMENT AT [facility] Provider Note   CSN: [id number] Arrival date & time: 08/28/24  1009     Patient presents with: No chief complaint on file.   Evan Higgins is a 43 y.o. male.   43 yo M with a chief complaints of chest pain.  This is pinpoint just left of the sternal border started this morning when he woke up and now has completely resolved.  He says he gets this from time to time.  He is not sure what makes it happen.  Had a heart attack in the past.  He is also been having some trouble with his left arm since the heart attack.  Feels like it goes into a fist and he has trouble opening it.  Usually massages it and improves.  Feels it gets weak at times.        Prior to Admission medications   Medication Sig Start Date End Date Taking? Authorizing Provider  aspirin  81 MG chewable tablet Chew 1 tablet (81 mg total) by mouth daily. 12/27/23  Yes   atorvastatin  (LIPITOR) 80 MG tablet Take 1 tablet (80 mg total) by mouth daily for cholesterol. 04/23/24  Yes Vannie Reche RAMAN, NP  lisinopril  (ZESTRIL ) 5 MG tablet Take 1 tablet by mouth daily. 01/08/24 01/07/25 Yes [provider]  nitroGLYCERIN  (NITROSTAT ) 0.4 MG SL tablet Place 1 tablet (0.4 mg total) under the tongue every 5 (five) minutes as needed for chest pain. 12/27/23  Yes   ticagrelor  (BRILINTA ) 90 MG TABS tablet Take 1 tablet (90 mg total) by mouth 2 (two) times daily. 12/27/23  Yes   isosorbide  mononitrate (IMDUR ) 30 MG 24 hr tablet Take 1 tablet (30 mg total) by mouth daily. 05/13/24 08/14/24  Raford Riggs, MD  nystatin  (MYCOSTATIN /NYSTOP ) powder Apply to affected area 1-2 times daily Patient not taking: Reported on 08/28/2024 11/28/23     metoprolol  succinate (TOPROL -XL) 25 MG 24 hr tablet Take 2 tablets (50 mg total) by mouth daily for blood pressure and heart. 04/17/24 04/17/24  Walker, Caitlin S, NP    Allergies: Patient has no known allergies.    Review of  Systems  Updated Vital Signs BP 112/70   Pulse (!) 56   Temp 98 F (36.7 C) (Oral)   Resp 17   Ht 6' 1 (1.854 m)   Wt 82 kg   SpO2 96%   BMI 23.85 kg/m   Physical Exam Vitals and nursing note reviewed.  Constitutional:      Appearance: He is well-developed.  HENT:     Head: Normocephalic and atraumatic.  Eyes:     Pupils: Pupils are equal, round, and reactive to light.  Neck:     Vascular: No JVD.  Cardiovascular:     Rate and Rhythm: Normal rate and regular rhythm.     Heart sounds: No murmur heard.    No friction rub. No gallop.  Pulmonary:     Effort: No respiratory distress.     Breath sounds: No wheezing.  Chest:     Chest wall: No tenderness.  Abdominal:     General: There is no distension.     Tenderness: There is no abdominal tenderness. There is no guarding or rebound.  Musculoskeletal:        General: Normal range of motion.     Cervical back: Normal range of motion and neck supple.  Skin:    Coloration: Skin is not pale.     Findings: No rash.  Neurological:     Mental Status: He is alert and oriented to person, place, and time.  Psychiatric:        Behavior: Behavior normal.     (all labs ordered are listed, but only abnormal results are displayed) Labs Reviewed  BASIC METABOLIC PANEL WITH GFR - Abnormal; Notable for the following components:      Result Value   Calcium  8.5 (*)    All other components within normal limits  CBC - Abnormal; Notable for the following components:   MCV 100.2 (*)    All other components within normal limits  TROPONIN I (HIGH SENSITIVITY) - Abnormal; Notable for the following components:   Troponin I (High Sensitivity) 29 (*)    All other components within normal limits  TROPONIN I (HIGH SENSITIVITY)    EKG: EKG Interpretation Date/Time:  Thursday August 28 2024 10:16:29 EDT Ventricular Rate:  54 PR Interval:  124 QRS Duration:  88 QT Interval:  371 QTC Calculation: 352 R Axis:   78  Text  Interpretation: Sinus rhythm No significant change since last tracing Confirmed by Emil Share [phone]) on 08/28/2024 11:32:50 AM  Radiology: [facility] Chest Port 1 View Result Date: 08/28/2024 CLINICAL DATA:  Chest pain. EXAM: PORTABLE CHEST 1 VIEW COMPARISON:  05/29/2024 FINDINGS: The heart size and mediastinal contours are within normal limits. Both lungs are clear. The visualized skeletal structures are unremarkable. IMPRESSION: No active disease. Electronically Signed   By: Norleen DELENA Kil M.D.   On: 08/28/2024 11:16     .Critical Care  Performed by: Emil Share, DO Authorized by: Emil Share, DO   Critical care provider statement:    Critical care time (minutes):  35   Critical care time was exclusive of:  Separately billable procedures and treating other patients   Critical care was time spent personally by me on the following activities:  Development of treatment plan with patient or surrogate, discussions with consultants, evaluation of patient's response to treatment, examination of patient, ordering and review of laboratory studies, ordering and review of radiographic studies, ordering and performing treatments and interventions, pulse oximetry, re-evaluation of patient's condition and review of old charts   Care discussed with: admitting provider      Medications Ordered in the ED - No data to display                                  Medical Decision Making Amount and/or Complexity of Data Reviewed Labs: ordered. Radiology: ordered.  Risk Decision regarding hospitalization.   43 yo M with a chief complaint of chest pain.  This is pinpoint just left of the sternal border.  Sounds like he has been having this for a little while.  Comes and goes.  Was worse this morning.  Not obviously exertional.  Patient with history of an ST elevation MI.  On record review has been seen for headaches and chest pain since.  Will obtain 2 troponins.  Chest x-ray.  Blood work.  Reassess.  The  patients results and plan were reviewed and discussed.   Any x-rays performed were independently reviewed by myself.   Differential diagnosis were considered with the presenting HPI.  Medications - No data to display  Vitals:   08/28/24 1415 08/28/24 1430 08/28/24 1445 08/28/24 1500  BP: 109/66 107/76 106/62 112/70  Pulse: (!) 50 62 (!) 56 (!) 56  Resp: 13 14 (!) 7 17  Temp:  TempSrc:      SpO2: 99% 100% 95% 96%  Weight:      Height:        Final diagnoses:  Chest pain with high risk for cardiac etiology    Admission/ observation were discussed with the admitting physician, patient and/or family and they are comfortable with the plan.       Final diagnoses:  Chest pain with high risk for cardiac etiology    ED Discharge Orders     None          Emil Share, DO 08/28/24 1537

## 2024-08-28 NOTE — H&P (Signed)
 History and Physical    Evan Higgins FMW:[id number] DOB: 24-Jan-1981 DOA: 08/28/2024  Referring MD/NP/PA:   PCP: Jori Small, FNP   Patient coming from:  The patient is coming from home.     Chief Complaint: chest pain  HPI: Evan Higgins is a 43 y.o. male with medical history significant of CAD, STEMI (s/p of DES), HTN, H LD, tobacco abuse, gout, who presents with chest pain.   Patient states that his chest pain started in the morning which woke him up. It is located in the substernal area, pressure-like, 5-8 out of 10 in severity initially, currently subsided, nonradiating.  Patient had mild SOB which has resolved.  He has mild dry cough, no fever or chills.  Patient does not have nausea, vomiting, diarrhea or abdominal pain.  No symptoms of UTI.  No rectal bleeding or dark stool.  No recent fall or head injury. Patient states that he has mild intermittent chest pain in the past 2 weeks, sometimes worsening with exertion. Pt was given 324 mg of ASA by ems.  Pt states that he has been having some trouble with his left arm since the heart attack 2024. He has pain in his whole left hand and weakness in the whole left arm. No injury. Pt  states that he feels like it goes into a fist and he has trouble opening it.  Of note, Chart review revealed that pt was hospitalized from 9/29 - 07/31/2023 due to STEMI. Pt underwent LHC that revealed Mid LAD occlusion that was treated with successful DES and PTCA of the diagonal without complications. Pt was started on aspirin , Brilinta  and Lipitor. Pt also had Myoview  stress test on 05/13/2024 which was a low risk study.   Stress test on 05/13/24: Addendum by Okey Vina GAILS, MD on Fri May 09, 2024  3:46 PM    Lexiscan  stress is electrically nondiagnostic for ischemia   Myoview  scan  shows a small region of moderate decreaed tracer activity in the anteroseptal (mid/distal) and apical walls THis improves incompletely in the recovery images  consistent with scar with mild periinfarct ischemia   Left ventricular function is normal. Nuclear stress EF: 60% with distal septal and apical hypokeinsis . The left ventricular ejection fraction is normal (55-65%). End diastolic cavity size is normal.   CT images were obtained for attenuation correction and were examined for the presence of coronary calcium  when appropriate.   Stent and calcification present in LAD   Findings are consistent with infarction with peri-infarct ischemia.   Low risk study   Finalized by Okey Vina GAILS, MD on Fri May 09, 2024  3:32 PM     Lexiscan  stress is electrically nondiagnostic for ischemia   Myoview  scan shows normal perfusion  No ischemia or scar   Left ventricular function is normal. Nuclear stress EF: 60%. The left ventricular ejection fraction is normal (55-65%). End diastolic cavity size is normal.   CT images were obtained for attenuation correction and were examined for the presence of coronary calcium  when appropriate.   Coronary calcium  was present on the attenuation correction CT images. Minimal coronary calcifications were present. Coronary calcifications were present in the left anterior descending artery distribution(s).   Low risk study    Data reviewed independently and ED Course: pt was found to have trop 11 --> 29 --> 26, WBC 6.7, GFR> 60.  Temperature normal, blood pressure 117/67, heart rate of 49 --> 60s, RR 15, oxygen saturation 95% on room air.  Chest x-ray  negative.  Patient is placed in telemetry bed for observation.  Epic message is sent to [facility] Pool for [facility] card consult.   EKG: I have personally reviewed.  Sinus rhythm, QTc 352, ST elevation in V2-V3, T wave inversion in V4   Review of Systems:   General: no fevers, chills, no body weight gain, fatigue HEENT: no blurry vision, hearing changes or sore throat Respiratory: has dyspnea, coughing, no wheezing CV: has chest pain, no palpitations GI: no nausea, vomiting,  abdominal pain, diarrhea, constipation GU: no dysuria, burning on urination, increased urinary frequency, hematuria  Ext: no leg edema Neuro: no vision change or hearing loss. Has left arm weakness and pain Skin: no rash, no skin tear. MSK: No muscle spasm, no deformity, no limitation of range of movement in spin Heme: No easy bruising.  Travel history: No recent long distant travel.   Allergy: No Known Allergies  Past Medical History:  Diagnosis Date   CAD (coronary artery disease), native artery transplanted heart 03/08/2024   Gout    HLD (hyperlipidemia)    Homelessness 03/08/2024   Myocardial infarction [facility])    Tobacco abuse 03/08/2024    Past Surgical History:  Procedure Laterality Date   CARDIAC CATHETERIZATION     WISDOM TOOTH EXTRACTION      Social History:  reports that he has been smoking cigarettes. He has never used smokeless tobacco. He reports that he does not currently use alcohol. He reports that he does not use drugs.  Family History:  Family History  Problem Relation Age of Onset   Liver disease Mother      Prior to Admission medications   Medication Sig Start Date End Date Taking? Authorizing Provider  aspirin  81 MG chewable tablet Chew 1 tablet (81 mg total) by mouth daily. 12/27/23     atorvastatin  (LIPITOR) 80 MG tablet Take 1 tablet (80 mg total) by mouth daily for cholesterol. 04/23/24   Walker, Caitlin S, NP  isosorbide  mononitrate (IMDUR ) 30 MG 24 hr tablet Take 1 tablet (30 mg total) by mouth daily. 05/13/24 08/14/24  Raford Riggs, MD  lisinopril  (ZESTRIL ) 5 MG tablet Take 1 tablet by mouth daily. 01/08/24 01/07/25  [provider]  nitroGLYCERIN  (NITROSTAT ) 0.4 MG SL tablet Place 1 tablet (0.4 mg total) under the tongue every 5 (five) minutes as needed for chest pain. 12/27/23     nystatin  (MYCOSTATIN /NYSTOP ) powder Apply to affected area 1-2 times daily Patient not taking: Reported on 08/28/2024 11/28/23     ticagrelor  (BRILINTA ) 90 MG  TABS tablet Take 1 tablet (90 mg total) by mouth 2 (two) times daily. 12/27/23     metoprolol  succinate (TOPROL -XL) 25 MG 24 hr tablet Take 2 tablets (50 mg total) by mouth daily for blood pressure and heart. 04/17/24 04/17/24  Vannie Reche RAMAN, NP    Physical Exam: Vitals:   08/28/24 1834 08/28/24 1950 08/28/24 2347  BP: 107/65 107/65 112/67  Pulse: (!) 53 (!) 56 (!) 56  Resp: 16 18 18   Temp: (!) 97.5 F (36.4 C) 98.3 F (36.8 C) 97.9 F (36.6 C)  SpO2: 100% 99% 98%   General: Not in acute distress HEENT:       Eyes: PERRL, EOMI, no jaundice       ENT: No discharge from the ears and nose, no pharynx injection, no tonsillar enlargement.        Neck: No JVD, no bruit, no mass felt. Heme: No neck lymph node enlargement. Cardiac: S1/S2, RRR, No  murmurs, No gallops or rubs. Respiratory: No rales, wheezing, rhonchi or rubs. GI: Soft, nondistended, nontender, no rebound pain, no organomegaly, BS present. GU: No hematuria Ext: No pitting leg edema bilaterally. 1+DP/PT pulse bilaterally. Musculoskeletal: No joint deformities, No joint redness or warmth, no limitation of ROM in spin. Skin: No rashes.  Neuro: Alert, oriented X3, cranial nerves II-XII grossly intact, moves all extremities normally.  Psych: Patient is not psychotic, no suicidal or hemocidal ideation.  Labs on Admission: I have personally reviewed following labs and imaging studies  CBC: Recent Labs  Lab 08/28/24 1119  WBC 6.7  HGB 13.8  HCT 42.8  MCV 100.2*  PLT 174   Basic Metabolic Panel: Recent Labs  Lab 08/28/24 1119  NA 137  K 4.2  CL 106  CO2 22  GLUCOSE 85  BUN 12  CREATININE 0.77  CALCIUM  8.5*   GFR: Estimated Creatinine Clearance: 134.6 mL/min (by C-G formula based on SCr of 0.77 mg/dL). Liver Function Tests: No results for input(s): AST, ALT, ALKPHOS, BILITOT, PROT, ALBUMIN in the last 168 hours. No results for input(s): LIPASE, AMYLASE in the last 168 hours. No results for  input(s): AMMONIA in the last 168 hours. Coagulation Profile: Recent Labs  Lab 08/28/24 1900  INR 1.0   Cardiac Enzymes: No results for input(s): CKTOTAL, CKMB, CKMBINDEX, TROPONINI in the last 168 hours. BNP (last 3 results) No results for input(s): PROBNP in the last 8760 hours. HbA1C: No results for input(s): HGBA1C in the last 72 hours. CBG: No results for input(s): GLUCAP in the last 168 hours. Lipid Profile: No results for input(s): CHOL, HDL, LDLCALC, TRIG, CHOLHDL, LDLDIRECT in the last 72 hours. Thyroid Function Tests: No results for input(s): TSH, T4TOTAL, FREET4, T3FREE, THYROIDAB in the last 72 hours. Anemia Panel: No results for input(s): VITAMINB12, FOLATE, FERRITIN, TIBC, IRON, RETICCTPCT in the last 72 hours. Urine analysis:    Component Value Date/Time   LABSPEC 1.010 08/01/2014 0956   PHURINE 7.0 08/01/2014 0956   GLUCOSEU NEGATIVE 08/01/2014 0956   HGBUR MODERATE (A) 08/01/2014 0956   BILIRUBINUR NEGATIVE 08/01/2014 0956   KETONESUR NEGATIVE 08/01/2014 0956   PROTEINUR NEGATIVE 08/01/2014 0956   UROBILINOGEN 0.2 08/01/2014 0956   NITRITE NEGATIVE 08/01/2014 0956   LEUKOCYTESUR NEGATIVE 08/01/2014 0956   Sepsis Labs: @LABRCNTIP (procalcitonin:4,lacticidven:4) )No results found for this or any previous visit (from the past 240 hours).   Radiological Exams on Admission:   Assessment/Plan Principal Problem:   Chest pain Active Problems:   CAD (coronary artery disease)   Left arm weakness   HLD (hyperlipidemia)   Tobacco abuse   Assessment and Plan:  Chest pain and hx of CAD (coronary artery disease): s/p of DES. Trop 11 --> 29 --> 26 --> 25. His chest pain has resolved. Epic message is sent to [facility] Pool for [facility] card consult.  - place to tele bed for observation - Trend Trop - prn Morphine, Percocet, Tylenol  for pain - prn Nitroglycerin  - ASA and Brilinta  - Statin: Lipitor -Continue  home metoprolol  - Risk factor stratification: will check FLP and A1C  - check UDS  Left arm weakness and pain: Etiology is not clear. - Follow-up MRI of C-spine to rule out C-spine radiculopathy.  HLD (hyperlipidemia) -Lipitor  Tobacco abuse - Nicotine  patch    DVT ppx: SQ Heparin     Code Status: Full code     Family Communication:     not done, no family member is at bed side.  Disposition Plan:  Anticipate discharge back to previous environment  Consults called:  Epic message is sent to [facility] Pool for [facility] card consult.  Admission status and Level of care: Telemetry:    for obs     Dispo: The patient is from: Home              Anticipated d/c is to: Home              Anticipated d/c date is: 1 day              Patient currently is not medically stable to d/c.    Severity of Illness:  The appropriate patient status for this patient is OBSERVATION. Observation status is judged to be reasonable and necessary in order to provide the required intensity of service to ensure the patient's safety. The patient's presenting symptoms, physical exam findings, and initial radiographic and laboratory data in the context of their medical condition is felt to place them at decreased risk for further clinical deterioration. Furthermore, it is anticipated that the patient will be medically stable for discharge from the hospital within 2 midnights of admission.        Date of Service 08/29/2024    Caleb Exon [facility]   If 7PM-7AM, please contact night-coverage [email or web address] 08/29/2024, 1:26 AM

## 2024-08-28 NOTE — ED Notes (Signed)
 [facility] here to transport patient to [facility].

## 2024-08-28 NOTE — ED Triage Notes (Signed)
 PT bib [facility] from home for 7/10 midsternal CP that woke him up.  2/10 on EMS arrival. Nio NV or SOB. Endorses pain is intermittent, sometimes worsening with exertion over the last week or so.  Elevation in leads 2/3.  Hx of MI about a year ago.  Pain has resolved on arrival.   324 ASA  116/62 HR 60 98% RA, CBG 104  18G L. FA

## 2024-08-29 ENCOUNTER — Other Ambulatory Visit: Payer: Self-pay

## 2024-08-29 ENCOUNTER — Observation Stay: Payer: MEDICAID

## 2024-08-29 ENCOUNTER — Encounter
Admission: AD | Disposition: A | Discharge status: Home / Self Care | Payer: Self-pay | Source: Other Acute Inpatient Hospital | Attending: Internal Medicine

## 2024-08-29 DIAGNOSIS — I10 Essential (primary) hypertension: Secondary | ICD-10-CM | POA: Diagnosis not present

## 2024-08-29 DIAGNOSIS — I25110 Atherosclerotic heart disease of native coronary artery with unstable angina pectoris: Secondary | ICD-10-CM | POA: Diagnosis not present

## 2024-08-29 DIAGNOSIS — R079 Chest pain, unspecified: Secondary | ICD-10-CM | POA: Diagnosis not present

## 2024-08-29 DIAGNOSIS — E785 Hyperlipidemia, unspecified: Secondary | ICD-10-CM | POA: Diagnosis not present

## 2024-08-29 DIAGNOSIS — I200 Unstable angina: Secondary | ICD-10-CM

## 2024-08-29 HISTORY — PX: LEFT HEART CATH AND CORONARY ANGIOGRAPHY: CATH118249

## 2024-08-29 LAB — HEMOGLOBIN A1C
Hgb A1c MFr Bld: 4.7 % — ABNORMAL LOW (ref 4.8–5.6)
Mean Plasma Glucose: 88.19 mg/dL

## 2024-08-29 LAB — HIV ANTIBODY (ROUTINE TESTING W REFLEX): HIV Screen 4th Generation wRfx: NONREACTIVE

## 2024-08-29 LAB — CBC
HCT: 41.1 % (ref 39.0–52.0)
Hemoglobin: 13.4 g/dL (ref 13.0–17.0)
MCH: 31.6 pg (ref 26.0–34.0)
MCHC: 32.6 g/dL (ref 30.0–36.0)
MCV: 96.9 fL (ref 80.0–100.0)
Platelets: 175 K/uL (ref 150–400)
RBC: 4.24 MIL/uL (ref 4.22–5.81)
RDW: 11.5 % (ref 11.5–15.5)
WBC: 8.1 K/uL (ref 4.0–10.5)
nRBC: 0 % (ref 0.0–0.2)

## 2024-08-29 LAB — BASIC METABOLIC PANEL WITH GFR
Anion gap: 11 (ref 5–15)
BUN: 12 mg/dL (ref 6–20)
CO2: 26 mmol/L (ref 22–32)
Calcium: 9.1 mg/dL (ref 8.9–10.3)
Chloride: 103 mmol/L (ref 98–111)
Creatinine, Ser: 0.76 mg/dL (ref 0.61–1.24)
GFR, Estimated: >60 mL/min (ref >60)
Glucose, Bld: 84 mg/dL (ref 70–99)
Potassium: 3.9 mmol/L (ref 3.5–5.1)
Sodium: 140 mmol/L (ref 135–145)

## 2024-08-29 LAB — URINE DRUG SCREEN, QUALITATIVE (ARMC ONLY)
Amphetamines, Ur Screen: NOT DETECTED
Barbiturates, Ur Screen: NOT DETECTED
Benzodiazepine, Ur Scrn: POSITIVE — AB
Cannabinoid 50 Ng, Ur ~~LOC~~: POSITIVE — AB
Cocaine Metabolite,Ur ~~LOC~~: NOT DETECTED
MDMA (Ecstasy)Ur Screen: NOT DETECTED
Methadone Scn, Ur: NOT DETECTED
Opiate, Ur Screen: NOT DETECTED
Phencyclidine (PCP) Ur S: NOT DETECTED
Tricyclic, Ur Screen: NOT DETECTED

## 2024-08-29 LAB — LIPID PANEL
Cholesterol: 110 mg/dL (ref 0–200)
HDL: 49 mg/dL (ref >40)
LDL Cholesterol: 54 mg/dL (ref 0–99)
Total CHOL/HDL Ratio: 2.2 ratio
Triglycerides: 37 mg/dL (ref <150)
VLDL: 7 mg/dL (ref 0–40)

## 2024-08-29 LAB — TROPONIN I (HIGH SENSITIVITY)
Troponin I (High Sensitivity): 23 ng/L — ABNORMAL HIGH (ref <18)
Troponin I (High Sensitivity): 18 ng/L — ABNORMAL HIGH (ref <18)

## 2024-08-29 SURGERY — LEFT HEART CATH AND CORONARY ANGIOGRAPHY
Anesthesia: Moderate Sedation

## 2024-08-29 MED ORDER — ASPIRIN 81 MG PO CHEW: 81.0000 mg | CHEWABLE_TABLET | ORAL | Status: DC

## 2024-08-29 MED ORDER — SODIUM CHLORIDE 0.9% FLUSH
3.0000 mL | INTRAVENOUS | Status: DC | PRN
Start: 2024-08-29 — End: 2024-08-29

## 2024-08-29 MED ORDER — NITROGLYCERIN 0.4 MG SL SUBL
0.4000 mg | SUBLINGUAL_TABLET | SUBLINGUAL | 0 refills | Status: AC | PRN
  Filled 2024-08-29: qty 25, 8d supply, fill #0

## 2024-08-29 MED ORDER — VERAPAMIL HCL 2.5 MG/ML IV SOLN
INTRAVENOUS | Status: DC | PRN
  Administered 2024-08-29: 2.5 mg via INTRACORONARY

## 2024-08-29 MED ORDER — IOHEXOL 300 MG/ML  SOLN
INTRAMUSCULAR | Status: DC | PRN
  Administered 2024-08-29: 60 mL

## 2024-08-29 MED ORDER — NITROGLYCERIN 1 MG/10 ML FOR IR/CATH LAB
INTRA_ARTERIAL | Status: AC
  Filled 2024-08-29: qty 10

## 2024-08-29 MED ORDER — ISOSORBIDE MONONITRATE ER 30 MG PO TB24
30.0000 mg | ORAL_TABLET | Freq: Every day | ORAL | 3 refills | Status: AC
  Filled 2024-08-29: qty 30, 30d supply, fill #0

## 2024-08-29 MED ORDER — HEPARIN (PORCINE) IN NACL 1000-0.9 UT/500ML-% IV SOLN
INTRAVENOUS | Status: AC
  Filled 2024-08-29: qty 1000

## 2024-08-29 MED ORDER — FREE WATER: 500.0000 mL | Freq: Once | Status: DC

## 2024-08-29 MED ORDER — NICOTINE 14 MG/24HR TD PT24
14.0000 mg | MEDICATED_PATCH | TRANSDERMAL | 0 refills | Status: AC
Start: 2024-08-29 — End: 2025-08-29
  Filled 2024-08-29: qty 30, 30d supply, fill #0

## 2024-08-29 MED ORDER — HEPARIN (PORCINE) IN NACL 1000-0.9 UT/500ML-% IV SOLN
INTRAVENOUS | Status: DC | PRN
  Administered 2024-08-29: 1000 mL

## 2024-08-29 MED ORDER — MIDAZOLAM HCL 2 MG/2ML IJ SOLN
INTRAMUSCULAR | Status: AC
  Filled 2024-08-29: qty 2

## 2024-08-29 MED ORDER — FREE WATER
500.0000 mL | Freq: Once | Status: AC
  Administered 2024-08-29: 500 mL via ORAL

## 2024-08-29 MED ORDER — GADOBUTROL 1 MMOL/ML IV SOLN
7.5000 mL | Freq: Once | INTRAVENOUS | Status: AC | PRN
  Administered 2024-08-29: 7.5 mL via INTRAVENOUS

## 2024-08-29 MED ORDER — HEPARIN SODIUM (PORCINE) 1000 UNIT/ML IJ SOLN
INTRAMUSCULAR | Status: DC | PRN
  Administered 2024-08-29: 4000 [IU] via INTRAVENOUS

## 2024-08-29 MED ORDER — MIDAZOLAM HCL (PF) 2 MG/2ML IJ SOLN
INTRAMUSCULAR | Status: DC | PRN
  Administered 2024-08-29: 1 mg via INTRAVENOUS

## 2024-08-29 MED ORDER — HEPARIN SODIUM (PORCINE) 1000 UNIT/ML IJ SOLN
INTRAMUSCULAR | Status: AC
Start: 2024-08-29 — End: 2024-08-29
  Filled 2024-08-29: qty 10

## 2024-08-29 MED ORDER — AMLODIPINE BESYLATE 5 MG PO TABS
2.5000 mg | ORAL_TABLET | Freq: Every day | ORAL | Status: DC
  Administered 2024-08-29: 2.5 mg via ORAL
  Filled 2024-08-29: qty 1

## 2024-08-29 MED ORDER — FENTANYL CITRATE (PF) 100 MCG/2ML IJ SOLN
INTRAMUSCULAR | Status: DC | PRN
  Administered 2024-08-29: 25 ug via INTRAVENOUS

## 2024-08-29 MED ORDER — SODIUM CHLORIDE 0.9 % IV SOLN: 250.0000 mL | INTRAVENOUS | Status: DC | PRN

## 2024-08-29 MED ORDER — NITROGLYCERIN 1 MG/10 ML FOR IR/CATH LAB
INTRA_ARTERIAL | Status: DC | PRN
  Administered 2024-08-29: 200 ug via INTRACORONARY

## 2024-08-29 MED ORDER — FENTANYL CITRATE (PF) 100 MCG/2ML IJ SOLN
INTRAMUSCULAR | Status: AC
  Filled 2024-08-29: qty 2

## 2024-08-29 MED ORDER — OXYCODONE-ACETAMINOPHEN 5-325 MG PO TABS: 1.0000 | ORAL_TABLET | ORAL | Status: DC | PRN

## 2024-08-29 MED ORDER — VERAPAMIL HCL 2.5 MG/ML IV SOLN
INTRAVENOUS | Status: AC
  Filled 2024-08-29: qty 2

## 2024-08-29 MED ORDER — LIDOCAINE HCL 1 % IJ SOLN
INTRAMUSCULAR | Status: AC
  Filled 2024-08-29: qty 20

## 2024-08-29 MED ORDER — LACTATED RINGERS IV SOLN: INTRAVENOUS | Status: DC

## 2024-08-29 MED ORDER — ASPIRIN 81 MG PO CHEW
CHEWABLE_TABLET | ORAL | Status: AC
Start: 2024-08-29 — End: 2024-08-29
  Administered 2024-08-29: 81 mg
  Filled 2024-08-29: qty 1

## 2024-08-29 MED ORDER — SODIUM CHLORIDE 0.9 % IV BOLUS
INTRAVENOUS | Status: DC | PRN
  Administered 2024-08-29: 250 mL via INTRAVENOUS

## 2024-08-29 MED ORDER — LIDOCAINE HCL (PF) 1 % IJ SOLN
INTRAMUSCULAR | Status: DC | PRN
  Administered 2024-08-29: 2 mL

## 2024-08-29 MED ORDER — SODIUM CHLORIDE 0.9% FLUSH: 3.0000 mL | Freq: Two times a day (BID) | INTRAVENOUS | Status: DC

## 2024-08-29 MED ORDER — AMLODIPINE BESYLATE 2.5 MG PO TABS
2.5000 mg | ORAL_TABLET | Freq: Every day | ORAL | 0 refills | Status: AC
  Filled 2024-08-29: qty 30, 30d supply, fill #0

## 2024-08-29 SURGICAL SUPPLY — 8 items
CATH INFINITI AMBI 5FR JK (CATHETERS) IMPLANT
DEVICE RAD TR BAND REGULAR (VASCULAR PRODUCTS) IMPLANT
DRAPE BRACHIAL (DRAPES) IMPLANT
GLIDESHEATH SLEND SS 6F .021 (SHEATH) IMPLANT
GUIDEWIRE INQWIRE 1.5J.035X260 (WIRE) IMPLANT
PACK CARDIAC CATH (CUSTOM PROCEDURE TRAY) ×1 IMPLANT
SET ATX-X65L (MISCELLANEOUS) IMPLANT
STATION PROTECTION PRESSURIZED (MISCELLANEOUS) IMPLANT

## 2024-08-29 NOTE — Consult Note (Signed)
 Cardiology Consultation   Patient ID: Evan Higgins MRN: [id number]; DOB: 1981-03-08  Admit date: 08/28/2024 Date of Consult: 08/29/2024  PCP:  Jori Small, FNP   [facility] Providers Cardiologist:  None        Patient Profile: Evan Higgins is a 43 y.o. male with a hx of coronary artery disease status post MI (07/2023) with DES-and LAD, hypertension, tobacco abuse, hyperlipidemia, who is being seen 08/29/2024 for the evaluation of chest pain at the request of Dr Jerelene.  History of Present Illness: Evan Higgins had a previous MI 07/2023 that woke him from sleep with burning sensation from his chest to his throat persistent daily chest pain as well as numbness in the right arm/leg.  His cath revealed 100% mid LAD stenosis.  He had PCI with 0.4 x 15 X Xience DES with 0% residual stenosis.  Echocardiogram revealed an LVEF of 55 to 60% with apical hypokinesis, GLS equals 14.8%.  It was otherwise unremarkable.  He had also been following with advanced hypertension clinic in [place] since 02/28/2024.  He had was experiencing homelessness and social work was consulted for resources.  He was referred to orthopedics for evaluation where he had had numbness and tingling in his left arm and leg was felt from potential nerve injury related to prior neck injury or strain.  Blood pressure had been well-controlled on his current medication regimen.  Lisinopril  had to be discontinued due to a prior kidney injury.  He was recommended for exercise stress testing to rule out cardiac causes of his chest discomfort.  He presented to the [facility] emergency department 08/28/2024 at 10 AM with complaints of chest pain.  It was left-sided substernal and started in the morning that woke him up and is now completely resolved.  He states that he gets the pain from time to time is not sure what makes it happen.  States that it was similar to the heart attack that he had in the past.  He did  also been having trouble with his left arm since his heart attack.  States that its pain is a 7 out of 10 on the pain scale.  States that he has no aggravating or alleviating symptoms.  Due to bed availability Evan Higgins patient was transported to [facility]  Patient was treated with aspirin  324 mg by EMS.  Vital signs revealed a blood pressure 112/70, pulse 56, temperature 98, respirations of 17, initial labs revealed calcium  8.5, high-sensitivity troponin 29, 26, 25, 23, and 18,Unremarkable BMP.  EKG revealed sinus bradycardia with no significant change from prior tracing with a rate of 54.  Chest x-ray showed no acute findings.  On evaluation this morning he stated that he was chest pain free.  Stated that he had been compliant with his current medication regimen of aspirin , Brilinta , and Lipitor.  Had recently undergone stress testing that was considered low risk back in July.  He is still concerned as though he feels as though the chest pain has been ongoing off and on since his original MI approximately a year prior.  He is worried there may be something that is going on with his heart.  He continues to have left arm weakness and pain.  Cardiology was consulted for ongoing management and concerns of chest discomfort and mildly elevated high-sensitivity troponin  Past Medical History:  Diagnosis Date   CAD (coronary artery disease), native artery transplanted heart 03/08/2024   Gout    HLD (hyperlipidemia)    Homelessness  03/08/2024   Myocardial infarction [facility])    Tobacco abuse 03/08/2024    Past Surgical History:  Procedure Laterality Date   CARDIAC CATHETERIZATION     WISDOM TOOTH EXTRACTION       Home Medications:  Prior to Admission medications   Medication Sig Start Date End Date Taking? Authorizing Provider  aspirin  81 MG chewable tablet Chew 1 tablet (81 mg total) by mouth daily. 12/27/23     atorvastatin  (LIPITOR) 80 MG tablet Take 1 tablet (80 mg total) by mouth daily for  cholesterol. 04/23/24   Walker, Caitlin S, NP  isosorbide  mononitrate (IMDUR ) 30 MG 24 hr tablet Take 1 tablet (30 mg total) by mouth daily. 05/13/24 08/14/24  Raford Riggs, MD  lisinopril  (ZESTRIL ) 5 MG tablet Take 1 tablet by mouth daily. 01/08/24 01/07/25  [provider]  nitroGLYCERIN  (NITROSTAT ) 0.4 MG SL tablet Place 1 tablet (0.4 mg total) under the tongue every 5 (five) minutes as needed for chest pain. 12/27/23     nystatin  (MYCOSTATIN /NYSTOP ) powder Apply to affected area 1-2 times daily Patient not taking: Reported on 08/28/2024 11/28/23     ticagrelor  (BRILINTA ) 90 MG TABS tablet Take 1 tablet (90 mg total) by mouth 2 (two) times daily. 12/27/23     metoprolol  succinate (TOPROL -XL) 25 MG 24 hr tablet Take 2 tablets (50 mg total) by mouth daily for blood pressure and heart. 04/17/24 04/17/24  Vannie Reche RAMAN, NP    Scheduled Meds:  aspirin   81 mg Oral Daily   [START ON 08/30/2024] aspirin   81 mg Oral Pre-Cath   atorvastatin   80 mg Oral Daily   free water  500 mL Oral Once   heparin  5,000 Units Subcutaneous Q8H   isosorbide  mononitrate  30 mg Oral Daily   nicotine   21 mg Transdermal Daily   ticagrelor   90 mg Oral BID   Continuous Infusions:  lactated ringers     PRN Meds: acetaminophen , hydrALAZINE, morphine injection, nitroGLYCERIN , ondansetron (ZOFRAN) IV, oxyCODONE-acetaminophen   Allergies:   No Known Allergies  Social History:   Social History   Socioeconomic History   Marital status: Single    Spouse name: Not on file   Number of children: Not on file   Years of education: Not on file   Highest education level: Not on file  Occupational History   Not on file  Tobacco Use   Smoking status: Some Days    Current packs/day: 0.50    Types: Cigarettes   Smokeless tobacco: Never   Tobacco comments:    1-2 every 2 days   Vaping Use   Vaping status: Never Used  Substance and Sexual Activity   Alcohol use: Not Currently    Comment: occasional   Drug  use: No    Frequency: 7.0 times per week    Types: Marijuana   Sexual activity: Not Currently  Other Topics Concern   Not on file  Social History Narrative   Not on file   Social Drivers of Health   Financial Resource Strain: High Risk (02/28/2024)   Overall Financial Resource Strain (CARDIA)    Difficulty of Paying Living Expenses: Very hard  Food Insecurity: No Food Insecurity (08/28/2024)   Hunger Vital Sign    Worried About Running Out of Food in the Last Year: Never true    Ran Out of Food in the Last Year: Never true  Transportation Needs: Unmet Transportation Needs (08/28/2024)   PRAPARE - Administrator, Civil Service (Medical): Yes  Lack of Transportation (Non-Medical): No  Physical Activity: Not on file  Stress: Not on file  Social Connections: Moderately Isolated (08/28/2024)   Social Connection and Isolation Panel    Frequency of Communication with Friends and Family: Twice a week    Frequency of Social Gatherings with Friends and Family: Twice a week    Attends Religious Services: 1 to 4 times per year    Active Member of [employer] or Organizations: No    Attends Banker Meetings: Never    Marital Status: Divorced  Catering Manager Violence: Not At Risk (08/28/2024)   Humiliation, Afraid, Rape, and Kick questionnaire    Fear of Current or Ex-Partner: No    Emotionally Abused: No    Physically Abused: No    Sexually Abused: No    Family History:    Family History  Problem Relation Age of Onset   Liver disease Mother      ROS:  Please see the history of present illness.  Review of Systems  Constitutional:  Positive for diaphoresis and malaise/fatigue.  Cardiovascular:  Positive for chest pain.  Musculoskeletal:  Positive for neck pain.  Neurological:  Positive for tingling and weakness.    All other ROS reviewed and negative.     Physical Exam/Data: Vitals:   08/28/24 1950 08/28/24 2347 08/29/24 0440 08/29/24 0800  BP: 107/65  112/67 109/76 (!) 102/58  Pulse: (!) 56 (!) 56 (!) 55 (!) 54  Resp: 18 18 18 16   Temp: 98.3 F (36.8 C) 97.9 F (36.6 C) 97.7 F (36.5 C) (!) 97.5 F (36.4 C)  TempSrc:   Oral   SpO2: 99% 98% 99% 100%   No intake or output data in the 24 hours ending 08/29/24 0953    08/28/2024   10:16 AM 04/17/2024    2:28 PM 02/28/2024    1:42 PM  Last 3 Weights  Weight (lbs) 180 lb 12.4 oz 180 lb 11.2 oz 177 lb  Weight (kg) 82 kg 81.965 kg 80.287 kg     There is no height or weight on file to calculate BMI.  General:  Well nourished, well developed, in no acute distress HEENT: normal Neck: no JVD Vascular: No carotid bruits; Distal pulses 2+ bilaterally Cardiac:  normal S1, S2; RRR; no murmur  Lungs:  clear to auscultation bilaterally, no wheezing, rhonchi or rales  Abd: soft, nontender, no hepatomegaly  Ext: no edema Musculoskeletal:  No deformities, BUE and BLE strength normal and equal Skin: warm and dry  Neuro:  CNs 2-12 intact, no focal abnormalities noted Psych:  Normal affect   EKG:  The EKG was personally reviewed and demonstrates: Sinus bradycardia with rate of 54 with no acute ischemic changes Telemetry:  Telemetry was personally reviewed and demonstrates: Sinus rhythm sinus bradycardia  Relevant CV Studies: Gated Myoview  Lexiscan  stress 05/09/2024   Lexiscan  stress is electrically nondiagnostic for ischemia   Myoview  scan  shows a small region of moderate decreaed tracer activity in the anteroseptal (mid/distal) and apical walls THis improves incompletely in the recovery images consistent with scar with mild periinfarct ischemia   Left ventricular function is normal. Nuclear stress EF: 60% with distal septal and apical hypokeinsis . The left ventricular ejection fraction is normal (55-65%). End diastolic cavity size is normal.   CT images were obtained for attenuation correction and were examined for the presence of coronary calcium  when appropriate.   Stent and calcification  present in LAD   Findings are consistent with infarction with  peri-infarct ischemia.   Low risk study  Exercise tolerance test 03/07/2024   No ST deviation was noted.   Study was not diagnostic due to failure to reach target heart rate.   No ischemic changes seen at sub-target heart rate.   Consider alternative ischemic evaluation such as coronary CT-A or pharmacologic stress testing.  Laboratory Data: High Sensitivity Troponin:   Recent Labs  Lab 08/28/24 1315 08/28/24 1900 08/28/24 2153 08/29/24 0105 08/29/24 0426  TROPONINIHS 29* 26* 25* 23* 18*     Chemistry Recent Labs  Lab 08/28/24 1119 08/29/24 0426  NA 137 140  K 4.2 3.9  CL 106 103  CO2 22 26  GLUCOSE 85 84  BUN 12 12  CREATININE 0.77 0.76  CALCIUM  8.5* 9.1  GFRNONAA >60 >60  ANIONGAP 9 11    No results for input(s): PROT, ALBUMIN, AST, ALT, ALKPHOS, BILITOT in the last 168 hours. Lipids  Recent Labs  Lab 08/29/24 0426  CHOL 110  TRIG 37  HDL 49  LDLCALC 54  CHOLHDL 2.2    Hematology Recent Labs  Lab 08/28/24 1119 08/29/24 0426  WBC 6.7 8.1  RBC 4.27 4.24  HGB 13.8 13.4  HCT 42.8 41.1  MCV 100.2* 96.9  MCH 32.3 31.6  MCHC 32.2 32.6  RDW 12.0 11.5  PLT 174 175   Thyroid No results for input(s): TSH, FREET4 in the last 168 hours.  BNPNo results for input(s): BNP, PROBNP in the last 168 hours.  DDimer No results for input(s): DDIMER in the last 168 hours.  Radiology/Studies:  MR CERVICAL SPINE W WO CONTRAST Result Date: 08/29/2024 CLINICAL DATA:  Initial evaluation for cervical spinal stenosis. EXAM: MRI CERVICAL SPINE WITHOUT AND WITH CONTRAST TECHNIQUE: Multiplanar and multiecho pulse sequences of the cervical spine, to include the craniocervical junction and cervicothoracic junction, were obtained without and with intravenous contrast. CONTRAST:  7.5mL GADAVIST GADOBUTROL 1 MMOL/ML IV SOLN COMPARISON:  Prior radiograph from 04/02/2008. FINDINGS: Alignment:  Straightening with slight reversal of the normal cervical lordosis. No listhesis. Vertebrae: Vertebral body height maintained without acute or chronic fracture. Bone marrow signal intensity within normal limits. No worrisome osseous lesions. Degenerative reactive endplate change with mild marrow edema present about the C5-6 through C7-T1 interspaces. No other abnormal marrow edema or enhancement. Cord: Normal signal and morphology.  No abnormal enhancement. Posterior Fossa, vertebral arteries, paraspinal tissues: Unremarkable. Disc levels: C2-C3: Mild disc bulge with uncovertebral spurring. No spinal stenosis. Foramina remain patent. C3-C4: Small right paracentral disc protrusion mildly indents the ventral thecal sac (series 17, image 8). Minimal cord flattening without cord signal changes. No significant spinal stenosis. Mild uncovertebral spurring without significant foraminal encroachment. C4-C5: Mild disc bulge with uncovertebral spurring. No spinal stenosis. Foramina remain patent. C5-C6: Mild intervertebral disc space narrowing with circumferential disc osteophyte complex. Flattening and partial effacement of the ventral thecal sac. No more than mild spinal stenosis. Severe left with mild-to-moderate right C6 foraminal narrowing. C6-C7: Diffuse disc osteophyte complex, slightly asymmetric to the right. Superimposed small right paracentral disc protrusion indents the right ventral thecal sac (series 17, image 24). No significant spinal stenosis. Foramina remain patent. C7-T1: Degenerative intervertebral disc space narrowing with circumferential disc osteophyte complex. Posterior component flattens and partially effaces the ventral thecal sac. Superimposed right worse than left facet hypertrophy with ligament flavum thickening. Resultant mild spinal stenosis. Severe bilateral C8 foraminal narrowing. IMPRESSION: 1. Multilevel cervical spondylosis with resultant mild spinal stenosis at C5-6 and C7-T1. No overt  cord impingement. 2. Multifactorial degenerative  changes with resultant multilevel foraminal narrowing as above. Notable findings include severe left with mild-to-moderate right C6 foraminal stenosis, with severe bilateral C8 foraminal narrowing. Electronically Signed   By: Morene Hoard M.D.   On: 08/29/2024 02:46   DG Chest Port 1 View Result Date: 08/28/2024 CLINICAL DATA:  Chest pain. EXAM: PORTABLE CHEST 1 VIEW COMPARISON:  05/29/2024 FINDINGS: The heart size and mediastinal contours are within normal limits. Both lungs are clear. The visualized skeletal structures are unremarkable. IMPRESSION: No active disease. Electronically Signed   By: Norleen DELENA Kil M.D.   On: 08/28/2024 11:16     Assessment and Plan: Coronary artery disease with unstable angina -Prior history of STEMI in September 2024 mid LAD occlusion treated with successful PCI/DES -Presented to [facility] emergency department with chest discomfort -High-sensitivity troponins mildly elevated trended flat -EKG with no acute ischemic changes noted -Prior stress testing considered low risk study but findings consistent with infarction with peri-infarct ischemia -With ongoing complaints of chest pain he has been scheduled for a left heart catheterization -Remain n.p.o. at this time -Further recommendations to follow left heart catheterization with possible percutaneous coronary artery intervention -Continued on aspirin , Brilinta , and statin -Continue with telemetry monitoring -EKG as needed for pain or changes  Hypertension -Blood pressure 102/58 -Vital signs per unit protocol - PTA lisinopril  previously discontinued due to kidney pain  Hyperlipidemia - Continued on atorvastatin  80 mg daily  Tobacco abuse - Total cessation continues to be recommended  Spinal stenosis -Mild spinal stenosis noted on MRI cervical spine completed this morning - Multifactorial degenerative changes noted - Longstanding history of  numbness to left arm and left leg - Ongoing management per IM - Would likely benefit from outpatient follow-up with neurology   Risk Assessment/Risk Scores:    TIMI Risk Score for Unstable Angina or Non-ST Elevation MI:   The patient's TIMI risk score is 4, which indicates a 20% risk of all cause mortality, new or recurrent myocardial infarction or need for urgent revascularization in the next 14 days.    Informed Consent   Shared Decision Making/Informed Consent The risks [stroke (1 in 1000), death (1 in 1000), kidney failure [usually temporary] (1 in 500), bleeding (1 in 200), allergic reaction [possibly serious] (1 in 200)], benefits (diagnostic support and management of coronary artery disease) and alternatives of a cardiac catheterization were discussed in detail with Evan Higgins and he is willing to proceed.         For questions or updates, please contact [facility] Please consult [email or web address] for contact info under      Signed, Omarri Eich, NP  08/29/2024 9:53 AM

## 2024-08-29 NOTE — TOC Transition Note (Signed)
 Transition of Care [facility]) - Discharge Note   Patient Details  Name: Evan Higgins MRN: [id number] Date of Birth: Oct 24, 1981  [facility] CM/SW Contact:  Racheal LITTIE Schimke, RN Phone Number: 08/29/2024, 3:22 PM   Clinical Narrative: To discharge home via [employer], voucher given and release of liability form signed and put in chart on unit.     Final next level of care: Home/Self Care Barriers to Discharge: Barriers Resolved   Patient Goals and CMS Choice Patient states their goals for this hospitalization and ongoing recovery are:: To return home.   Choice offered to / list presented to : NA      Discharge Placement                    Patient and family notified of of transfer: 08/29/24  Discharge Plan and Services Additional resources added to the After Visit Summary for                  DME Arranged: N/A DME Agency: NA       HH Arranged: NA HH Agency: NA        Social Drivers of Health (SDOH) Interventions SDOH Screenings   Food Insecurity: No Food Insecurity (08/28/2024)  Housing: High Risk (08/28/2024)  Transportation Needs: Unmet Transportation Needs (08/28/2024)  Utilities: At Risk (08/28/2024)  Financial Resource Strain: High Risk (02/28/2024)  Social Connections: Moderately Isolated (08/28/2024)  Tobacco Use: High Risk (08/28/2024)  Health Literacy: Adequate Health Literacy (02/28/2024)     Readmission Risk Interventions     No data to display

## 2024-08-29 NOTE — Progress Notes (Signed)
 Pt back from cath lab. Right radial dressing looks great. Pt given additional instructions to not use arm and pt agrees. Ready to go home he says.

## 2024-08-29 NOTE — Discharge Summary (Signed)
 Physician Discharge Summary   Patient: Evan Higgins MRN: [id number] DOB: 06-Aug-1981  Admit date:     08/28/2024  Discharge date: 08/29/24  Discharge Physician: Laree Lock   PCP: Jori Small, FNP   Recommendations at discharge:   Follow-up with PCP in 1-2 weeks -monitor BP, resume lisinopril  as tolerated  Follow-up with cardiology as recommended  Discharge Diagnoses: Principal Problem:   Chest pain Active Problems:   CAD (coronary artery disease)   Left arm weakness   HLD (hyperlipidemia)   Tobacco abuse   Unstable angina [facility])   Hospital Course: Evan Higgins is a 43 y.o. male with medical history significant of CAD, STEMI (s/p of DES), HTN, H LD, tobacco abuse, gout, who presents with substernal chest pain, nonradiating.  Also reports chronic left arm pain since heart attack in 2024. Admitted for further management to r/o ACS. Hospital course as below Patient was hospitalized from 9/29 - 07/31/2023 due to STEMI. Pt underwent LHC that revealed Mid LAD occlusion that was treated with successful DES and PTCA of the diagonal without complications. Pt was started on aspirin , Brilinta  and Lipitor. Pt also had Myoview  stress test on 05/13/2024 which was a low risk study  Chest pain and hx of CAD (coronary artery disease) Coronary spasm - s/p of DES 09/24. Chest pain resolved - LDL 54, HbA1c 4.7 - UDS + THC, BDZ  - underwent LHC 10/31, findings suggestive of coronary spasm that resolved with intracoronary nitroglycerin . Previously placed mid LAD patent. - asa, Brilinta , Lipitor, Metoprolol  - Continue Imdur , add amlodipine 2.5 mg for spasm prevention.  Held lisinopril , resume as blood pressure tolerates - Seen by Cardiology, appreciate recs. Patient feeling better and would like to go home today, ok per cardiology  Left arm weakness and pain Spinal stenosis - MRI c-spine shows multilevel cervical spondylosis with mild spinal stenosis at C5-6 and C7-T1.  No overt  cord impingement.  Multifactorial degenerative changes, severe left with mild to moderate right C6 foraminal stenosis, severe bilateral C8 foraminal narrowing - Follow-up with PCP outpatient   HLD (hyperlipidemia) -Lipitor   Tobacco abuse - Nicotine  patch, cessation counseled   Consultants: Cardiology Procedures performed: Cardiac catheterization 10/31  Disposition: Home Diet recommendation:  Discharge Diet Orders (From admission, onward)     Start     Ordered   08/29/24 0000  Diet - low sodium heart healthy        08/29/24 1412           DISCHARGE MEDICATION: Allergies as of 08/29/2024   No Known Allergies      Medication List     PAUSE taking these medications    lisinopril  5 MG tablet Wait to take this until your doctor or other care provider tells you to start again. Commonly known as: ZESTRIL  Take 1 tablet by mouth daily.       TAKE these medications    amLODipine 2.5 MG tablet Commonly known as: NORVASC Take 1 tablet (2.5 mg total) by mouth daily.   Aspirin  Low Dose 81 MG chewable tablet Generic drug: aspirin  Chew 1 tablet (81 mg total) by mouth daily.   atorvastatin  80 MG tablet Commonly known as: LIPITOR Take 1 tablet (80 mg total) by mouth daily for cholesterol.   isosorbide  mononitrate 30 MG 24 hr tablet Commonly known as: IMDUR  Take 1 tablet (30 mg total) by mouth daily.   nicotine  14 mg/24hr patch Commonly known as: NICODERM CQ  - dosed in mg/24 hours Place 1 patch (14 mg total) onto  the skin daily.   nitroGLYCERIN  0.4 MG SL tablet Commonly known as: NITROSTAT  Place 1 tablet (0.4 mg total) under the tongue every 5 (five) minutes as needed for chest pain.   Nyamyc  powder Generic drug: nystatin  Apply to affected area 1-2 times daily   ticagrelor  90 MG Tabs tablet Commonly known as: BRILINTA  Take 1 tablet (90 mg total) by mouth 2 (two) times daily.        Discharge Exam: Filed Weights   08/29/24 0958  Weight: 82 kg    General: Not in acute distress Cardiac: S1/S2, RRR, No murmurs, No gallops or rubs. Respiratory: No rales, wheezing, rhonchi or rubs. GI: Soft, nondistended, nontender, no rebound pain, no organomegaly, BS present. Ext: No pitting leg edema bilaterally. 1+DP/PT pulse bilaterally. Musculoskeletal: No joint deformities, No joint redness or warmth, no limitation of ROM in spin. Skin: No rashes Neuro: Alert, oriented X3, cranial nerves II-XII grossly intact, moves all extremities normally.   Condition at discharge: good  The results of significant diagnostics from this hospitalization (including imaging, microbiology, ancillary and laboratory) are listed below for reference.   Imaging Studies: CARDIAC CATHETERIZATION Result Date: 08/29/2024   Dist Cx lesion is 30% stenosed.   2nd Diag lesion is 60% stenosed.   Previously placed Mid LAD stent of unknown type is  widely patent.   The left ventricular systolic function is normal.   LV end diastolic pressure is mildly elevated.   The left ventricular ejection fraction is 55-65% by visual estimate. 1.  Widely patent mid LAD stent with no significant restenosis.  There was moderate stenosis at the distal and proximal edge of the stent as well as distal left circumflex.  This resolved completely with intracoronary nitroglycerin  which is suggestive of coronary spasm.  The second diagonal is jailed by the stent with moderate ostial stenosis and normal flow. 2.  Normal LV systolic function mildly elevated left ventricular end-diastolic pressure. Recommendations: The patient presentation might be related to coronary spasm.  I elected to add small dose amlodipine.  He denied cocaine use but will check urine drug screen given that he uses marijuana on a regular basis.   MR CERVICAL SPINE W WO CONTRAST Result Date: 08/29/2024 CLINICAL DATA:  Initial evaluation for cervical spinal stenosis. EXAM: MRI CERVICAL SPINE WITHOUT AND WITH CONTRAST TECHNIQUE:  Multiplanar and multiecho pulse sequences of the cervical spine, to include the craniocervical junction and cervicothoracic junction, were obtained without and with intravenous contrast. CONTRAST:  7.5mL GADAVIST GADOBUTROL 1 MMOL/ML IV SOLN COMPARISON:  Prior radiograph from 04/02/2008. FINDINGS: Alignment: Straightening with slight reversal of the normal cervical lordosis. No listhesis. Vertebrae: Vertebral body height maintained without acute or chronic fracture. Bone marrow signal intensity within normal limits. No worrisome osseous lesions. Degenerative reactive endplate change with mild marrow edema present about the C5-6 through C7-T1 interspaces. No other abnormal marrow edema or enhancement. Cord: Normal signal and morphology.  No abnormal enhancement. Posterior Fossa, vertebral arteries, paraspinal tissues: Unremarkable. Disc levels: C2-C3: Mild disc bulge with uncovertebral spurring. No spinal stenosis. Foramina remain patent. C3-C4: Small right paracentral disc protrusion mildly indents the ventral thecal sac (series 17, image 8). Minimal cord flattening without cord signal changes. No significant spinal stenosis. Mild uncovertebral spurring without significant foraminal encroachment. C4-C5: Mild disc bulge with uncovertebral spurring. No spinal stenosis. Foramina remain patent. C5-C6: Mild intervertebral disc space narrowing with circumferential disc osteophyte complex. Flattening and partial effacement of the ventral thecal sac. No more than mild spinal stenosis. Severe left with  mild-to-moderate right C6 foraminal narrowing. C6-C7: Diffuse disc osteophyte complex, slightly asymmetric to the right. Superimposed small right paracentral disc protrusion indents the right ventral thecal sac (series 17, image 24). No significant spinal stenosis. Foramina remain patent. C7-T1: Degenerative intervertebral disc space narrowing with circumferential disc osteophyte complex. Posterior component flattens and  partially effaces the ventral thecal sac. Superimposed right worse than left facet hypertrophy with ligament flavum thickening. Resultant mild spinal stenosis. Severe bilateral C8 foraminal narrowing. IMPRESSION: 1. Multilevel cervical spondylosis with resultant mild spinal stenosis at C5-6 and C7-T1. No overt cord impingement. 2. Multifactorial degenerative changes with resultant multilevel foraminal narrowing as above. Notable findings include severe left with mild-to-moderate right C6 foraminal stenosis, with severe bilateral C8 foraminal narrowing. Electronically Signed   By: Morene Hoard M.D.   On: 08/29/2024 02:46   DG Chest Port 1 View Result Date: 08/28/2024 CLINICAL DATA:  Chest pain. EXAM: PORTABLE CHEST 1 VIEW COMPARISON:  05/29/2024 FINDINGS: The heart size and mediastinal contours are within normal limits. Both lungs are clear. The visualized skeletal structures are unremarkable. IMPRESSION: No active disease. Electronically Signed   By: Norleen DELENA Kil M.D.   On: 08/28/2024 11:16    Microbiology: No results found for this or any previous visit.  Labs: CBC: Recent Labs  Lab 08/28/24 1119 08/29/24 0426  WBC 6.7 8.1  HGB 13.8 13.4  HCT 42.8 41.1  MCV 100.2* 96.9  PLT 174 175   Basic Metabolic Panel: Recent Labs  Lab 08/28/24 1119 08/29/24 0426  NA 137 140  K 4.2 3.9  CL 106 103  CO2 22 26  GLUCOSE 85 84  BUN 12 12  CREATININE 0.77 0.76  CALCIUM  8.5* 9.1   Liver Function Tests: No results for input(s): AST, ALT, ALKPHOS, BILITOT, PROT, ALBUMIN in the last 168 hours. CBG: No results for input(s): GLUCAP in the last 168 hours.  Discharge time spent: greater than 30 minutes.  Signed: Laree Lock, MD [facility] 08/29/2024

## 2024-09-01 ENCOUNTER — Encounter: Payer: Self-pay | Admitting: Cardiovascular Disease

## 2024-09-01 ENCOUNTER — Encounter: Payer: Self-pay | Admitting: Radiology

## 2024-10-13 ENCOUNTER — Other Ambulatory Visit: Payer: Self-pay

## 2024-10-13 ENCOUNTER — Other Ambulatory Visit (HOSPITAL_COMMUNITY): Payer: Self-pay

## 2024-10-13 MED ORDER — TICAGRELOR 90 MG PO TABS
90.0000 mg | ORAL_TABLET | Freq: Two times a day (BID) | ORAL | 1 refills | Status: AC
  Filled 2024-10-13 (×2): qty 180, 90d supply, fill #0

## 2024-10-14 ENCOUNTER — Other Ambulatory Visit (HOSPITAL_COMMUNITY): Payer: Self-pay

## 2024-10-16 ENCOUNTER — Other Ambulatory Visit (HOSPITAL_COMMUNITY): Payer: Self-pay

## 2024-10-17 ENCOUNTER — Other Ambulatory Visit (HOSPITAL_COMMUNITY): Payer: Self-pay

## 2024-10-19 ENCOUNTER — Other Ambulatory Visit: Payer: Self-pay

## 2024-10-19 ENCOUNTER — Encounter (HOSPITAL_COMMUNITY): Payer: Self-pay

## 2024-10-19 ENCOUNTER — Emergency Department (HOSPITAL_COMMUNITY)
Admission: EM | Admit: 2024-10-19 | Discharge: 2024-10-19 | Disposition: A | Discharge status: Home / Self Care | Payer: MEDICAID

## 2024-10-19 ENCOUNTER — Emergency Department (HOSPITAL_COMMUNITY): Payer: MEDICAID

## 2024-10-19 DIAGNOSIS — R059 Cough, unspecified: Secondary | ICD-10-CM | POA: Diagnosis not present

## 2024-10-19 DIAGNOSIS — J101 Influenza due to other identified influenza virus with other respiratory manifestations: Secondary | ICD-10-CM | POA: Diagnosis not present

## 2024-10-19 DIAGNOSIS — Z7982 Long term (current) use of aspirin: Secondary | ICD-10-CM | POA: Insufficient documentation

## 2024-10-19 DIAGNOSIS — E876 Hypokalemia: Secondary | ICD-10-CM

## 2024-10-19 DIAGNOSIS — R079 Chest pain, unspecified: Secondary | ICD-10-CM | POA: Diagnosis present

## 2024-10-19 LAB — RESP PANEL BY RT-PCR (RSV, FLU A&B, COVID)  RVPGX2
Influenza A by PCR: POSITIVE — AB
Influenza B by PCR: NEGATIVE
Resp Syncytial Virus by PCR: NEGATIVE
SARS Coronavirus 2 by RT PCR: NEGATIVE

## 2024-10-19 LAB — TROPONIN T, HIGH SENSITIVITY
Troponin T High Sensitivity: <15 ng/L (ref 0–19)
Troponin T High Sensitivity: <15 ng/L (ref 0–19)

## 2024-10-19 LAB — BASIC METABOLIC PANEL WITH GFR
Anion gap: 10 (ref 5–15)
BUN: 11 mg/dL (ref 6–20)
CO2: 19 mmol/L — ABNORMAL LOW (ref 22–32)
Calcium: 6.6 mg/dL — ABNORMAL LOW (ref 8.9–10.3)
Chloride: 110 mmol/L (ref 98–111)
Creatinine, Ser: 0.62 mg/dL (ref 0.61–1.24)
GFR, Estimated: >60 mL/min
Glucose, Bld: 126 mg/dL — ABNORMAL HIGH (ref 70–99)
Potassium: 3.2 mmol/L — ABNORMAL LOW (ref 3.5–5.1)
Sodium: 138 mmol/L (ref 135–145)

## 2024-10-19 LAB — CBC
HCT: 39.6 % (ref 39.0–52.0)
Hemoglobin: 12.9 g/dL — ABNORMAL LOW (ref 13.0–17.0)
MCH: 32 pg (ref 26.0–34.0)
MCHC: 32.6 g/dL (ref 30.0–36.0)
MCV: 98.3 fL (ref 80.0–100.0)
Platelets: 163 K/uL (ref 150–400)
RBC: 4.03 MIL/uL — ABNORMAL LOW (ref 4.22–5.81)
RDW: 11.5 % (ref 11.5–15.5)
WBC: 4.1 K/uL (ref 4.0–10.5)
nRBC: 0 % (ref 0.0–0.2)

## 2024-10-19 MED ORDER — PROMETHAZINE-DM 6.25-15 MG/5ML PO SYRP
2.5000 mL | ORAL_SOLUTION | Freq: Four times a day (QID) | ORAL | 0 refills | Status: AC | PRN
  Filled 2024-10-19: qty 118, 12d supply, fill #0

## 2024-10-19 MED ORDER — HYDROCOD POLI-CHLORPHE POLI ER 10-8 MG/5ML PO SUER
5.0000 mL | Freq: Once | ORAL | Status: AC
  Administered 2024-10-19: 5 mL via ORAL
  Filled 2024-10-19: qty 5

## 2024-10-19 MED ORDER — POTASSIUM CHLORIDE CRYS ER 20 MEQ PO TBCR
40.0000 meq | EXTENDED_RELEASE_TABLET | Freq: Once | ORAL | Status: AC
  Administered 2024-10-19: 40 meq via ORAL
  Filled 2024-10-19: qty 2

## 2024-10-19 NOTE — ED Notes (Signed)
Pt provided snack and beverage 

## 2024-10-19 NOTE — ED Triage Notes (Signed)
 Pt bib ems from home c/o sudden onset cold sweats with cp about 45 minutes ago. Hx MI  Pain is pressure similar to previous MI. Pt has had a cough for a couple days with intermittent cp. Pain increase with cough.   Pt endorses a HA. Pt denies CP at this time.   Pt denies sob and back pain. Pt endorses nausea the pass two days.  20G LAC  324 mg Aspirin    BP 114/66 HR 71 RA 98%

## 2024-10-19 NOTE — Discharge Instructions (Addendum)
 As we discussed, you tested positive for the flu today which is likely the cause of your symptoms.  Given this is a viral illness, no antibiotics are indicated. I recommend that you get plenty of rest and focus on symptomatic relief which includes Cepacol throat lozenges for sore throat, Mucinex for congestion, and tylenol /ibuprofen  as needed for fevers and bodyaches. I have also given you a prescription for promethazine  cough syrup which is a cough suppressant medication for you to take as prescribed as needed for management of your symptoms.  Do not drive or operate heavy machinery while taking this medication as it can make you drowsy.  I also recommend:  Increased fluid intake. Sports drinks offer valuable electrolytes, sugars, and fluids.  Breathing heated mist or steam (vaporizer or shower).  Eating chicken soup or other clear broths, and maintaining good nutrition.   Increasing usage of your inhaler if you have asthma.  Return to work when your temperature has returned to normal.  Gargle warm salt water  and spit it out for sore throat. Take benadryl  or Zyrtec  to decrease sinus secretions.  In addition, your potassium was low today, we have replaced this with oral supplementation today.  Please call your PCP to schedule a follow-up appointment when you are feeling a little bit better to have your potassium rechecked to ensure that it has normalized.  Follow Up: Follow up with your primary care doctor in 5-7 days for recheck of ongoing symptoms.  Return to emergency department for emergent changing or worsening of symptoms.

## 2024-10-19 NOTE — ED Notes (Signed)
Lab work redrawn and sent to lab.  

## 2024-10-19 NOTE — ED Provider Notes (Signed)
 " [place] EMERGENCY DEPARTMENT AT [facility] Provider Note   CSN: [id number] Arrival date & time: 10/19/24  1301     Patient presents with: Chest Pain   Evan Higgins is a 43 y.o. male.    Chest Pain  43 year old male presenting via EMS today with chest pain.  Patient's history is pertinent for a past MI.  Patient states this chest pain started about 3 days ago when he felt a cold coming on.  Reports that he has been trying to take vitamin C and drinking lots of water .  He reports he has not been taking anything like Tylenol  or ibuprofen  for the pain.  He has been taking the rest of his medications like he should be.  He states that the pain is worse when coughing.  He reports some cough, congestion, and rhinorrhea.  Patient reports the reason that he called EMS today was because he started to notice some sweating and fever that made him think this was like his last MI.     Prior to Admission medications  Medication Sig Start Date End Date Taking? Authorizing Provider  amLODipine  (NORVASC ) 2.5 MG tablet Take 1 tablet (2.5 mg total) by mouth daily. 08/29/24   Jerelene Critchley, MD  aspirin  81 MG chewable tablet Chew 1 tablet (81 mg total) by mouth daily. 12/27/23     atorvastatin  (LIPITOR) 80 MG tablet Take 1 tablet (80 mg total) by mouth daily for cholesterol. 04/23/24   Walker, Caitlin S, NP  isosorbide  mononitrate (IMDUR ) 30 MG 24 hr tablet Take 1 tablet (30 mg total) by mouth daily. 08/29/24 11/27/24  Jerelene Critchley, MD  [Paused] lisinopril  (ZESTRIL ) 5 MG tablet Take 1 tablet by mouth daily. Wait to take this until your doctor or other care provider tells you to start again. 01/08/24 01/07/25  [provider]  nicotine  (NICODERM CQ  - DOSED IN MG/24 HOURS) 14 mg/24hr patch Place 1 patch (14 mg total) onto the skin daily. 08/29/24 08/29/25  Jerelene Critchley, MD  nitroGLYCERIN  (NITROSTAT ) 0.4 MG SL tablet Place 1 tablet (0.4 mg total) under the tongue every 5  (five) minutes as needed for chest pain. 08/29/24   Jerelene Critchley, MD  nystatin  (MYCOSTATIN /NYSTOP ) powder Apply to affected area 1-2 times daily Patient not taking: Reported on 08/28/2024 11/28/23     ticagrelor  (BRILINTA ) 90 MG TABS tablet Take 1 tablet (90 mg total) by mouth 2 (two) times daily. 10/13/24     metoprolol  succinate (TOPROL -XL) 25 MG 24 hr tablet Take 2 tablets (50 mg total) by mouth daily for blood pressure and heart. 04/17/24 04/17/24  Walker, Caitlin S, NP    Allergies: Patient has no known allergies.    Review of Systems  Cardiovascular:  Positive for chest pain.  All other systems reviewed and are negative.   Updated Vital Signs BP 94/65 (BP Location: Right Arm)   Pulse 76   Temp 99.4 F (37.4 C) (Oral)   Resp 14   Ht 6' 1 (1.854 m)   Wt 81.6 kg   SpO2 99%   BMI 23.75 kg/m   Physical Exam Vitals and nursing note reviewed.  HENT:     Mouth/Throat:     Pharynx: Oropharynx is clear.  Cardiovascular:     Rate and Rhythm: Normal rate and regular rhythm.     Pulses: Normal pulses.     Heart sounds: Normal heart sounds.  Pulmonary:     Effort: Pulmonary effort is normal.     Breath  sounds: Normal breath sounds.  Chest:       Comments: Pain was not worse deep inspiration.  Pain palpating over the left chest.  Pain 8 out of 10 when doing the palpation. Abdominal:     General: Abdomen is flat. Bowel sounds are normal.     Palpations: Abdomen is soft.  Musculoskeletal:     Right lower leg: No edema.     Left lower leg: No edema.  Skin:    General: Skin is warm and dry.  Neurological:     General: No focal deficit present.     Mental Status: He is alert.     (all labs ordered are listed, but only abnormal results are displayed) Labs Reviewed  CBC - Abnormal; Notable for the following components:      Result Value   RBC 4.03 (*)    Hemoglobin 12.9 (*)    All other components within normal limits  RESP PANEL BY RT-PCR (RSV, FLU A&B, COVID)   [id number]  BASIC METABOLIC PANEL WITH GFR  TROPONIN T, HIGH SENSITIVITY  TROPONIN T, HIGH SENSITIVITY    EKG: EKG Interpretation Date/Time:  Sunday October 19 2024 13:03:23 EST Ventricular Rate:  69 PR Interval:  98 QRS Duration:  90 QT Interval:  336 QTC Calculation: 360 R Axis:   84  Text Interpretation: Sinus rhythm with short PR no acute ST/T changes similar to Oct 2025 Confirmed by Freddi Hamilton [phone]) on 10/19/2024 1:52:00 PM  Radiology: DG Chest 2 View Result Date: 10/19/2024 CLINICAL DATA:  Chest pain. EXAM: CHEST - 2 VIEW COMPARISON:  08/28/2024 FINDINGS: The cardiomediastinal contours are normal. The lungs are clear. Pulmonary vasculature is normal. No consolidation, pleural effusion, or pneumothorax. No acute osseous abnormalities are seen. IMPRESSION: No active cardiopulmonary disease. Electronically Signed   By: Andrea Gasman M.D.   On: 10/19/2024 13:35     Procedures   Medications Ordered in the ED - No data to display                                  Medical Decision Making  This patient presents to the ED for concern of chest pain, this involves an extensive number of treatment options, and is a complaint that carries with it a high risk of complications and morbidity.  The differential diagnosis includes acute coronary syndrome, congestive heart failure, pericarditis, pneumonia, pulmonary embolism, tension pneumothorax, esophageal rupture, aortic dissection, cardiac tamponade, musculoskeletal   Co morbidities that complicate the patient evaluation  Previous MI   Additional history obtained:  Additional history obtained from patient External records from outside source obtained and reviewed including previous outpatient visits and ER visit periods   Risk Stratification Score:  - HEART Score: 3 - Wells Score: 0 Perc Score: 0   Problem List / ED Course / Critical interventions / Medication management  Patient presented for chest pain. On  arrival to ED, patient was given ASA and an EKG was obtained. On exam patient was stable and without chest pain without any cough or palpation..   I Ordered, and personally interpreted labs.  The pertinent results include: No elevation in WBC. The patient was maintained on a cardiac monitor.  I personally viewed and interpreted the EKG/cardiac monitored which showed an underlying rhythm of: Sinus rhythm. I ordered imaging studies including chest xray to assess for process contributing to patient's symptoms. I independently visualized and interpreted imaging which showed no  cardiac or pulmonary abnormalities. I agree with the radiologist interpretation At the end of shift patient care was transferred to [facility].  She will follow-up on any further labs, imaging, and disposition.  Ddx:  These are considered less likely due to history of present illness and physical exam findings.  Congestive heart failure: patient denies orthopnea, cough, and leg edema Pneumonia: lungs are clear to auscultation bilaterally Pulmonary embolism: no recent surgeries, blood clot hx, hemoptysis, cancer hx, vitals stable Pneumothorax: lungs are clear to auscultation bilaterally Esophageal rupture: patient denies vomiting, heavy drinking, and hx of GERD Aortic dissection: vital signs are stable, no variation in pulse pressure Cardiac tamponade: absence of hypotension, JVD, and muffled heart sounds      Final diagnoses:  None    ED Discharge Orders     None          Rosaline Almarie MATSU, [place] 10/19/24 1525  "

## 2024-10-19 NOTE — ED Provider Triage Note (Signed)
 Emergency Medicine Provider Triage Evaluation Note  Evan Higgins , a 43 y.o. male  was evaluated in triage.  Pt complains of chest pain.  Has been dealing with a cough and not feeling well for about 3 days.  No fevers but he started sweating today..  Review of Systems  Positive: Chest pain with cough Negative: Fever  Physical Exam  BP 94/65 (BP Location: Right Arm)   Pulse 76   Temp 99.4 F (37.4 C) (Oral)   Resp 14   Ht 6' 1 (1.854 m)   Wt 81.6 kg   SpO2 99%   BMI 23.75 kg/m  Gen:   Awake, no distress Resp:  Normal effort, no rales Cardiac: RRR  Medical Decision Making  Medically screening exam initiated at 1:59 PM.  Appropriate orders placed.  Evan Higgins was informed that the remainder of the evaluation will be completed by another provider, this initial triage assessment does not replace that evaluation, and the importance of remaining in the ED until their evaluation is complete.  Likely respiratory infection. No hypoxia. EKG without ischemia.   Freddi Hamilton, MD 10/19/24 [phone]

## 2024-10-19 NOTE — ED Provider Notes (Signed)
 Care assumed from [facility], PA-C at shift change.  Please see their note for further information Physical Exam  BP (!) 103/49   Pulse 72   Temp 97.9 F (36.6 C) (Oral)   Resp 14   Ht 6' 1 (1.854 m)   Wt 81.6 kg   SpO2 100%   BMI 23.75 kg/m   Physical Exam  Procedures  Procedures  ED Course / MDM    Medical Decision Making Risk Prescription drug management.   Briefly: Patient with history of MI presents with chest pain, cough, congestion, rhinorrhea x 3 days.  CXR clear, labs pending.  EKG sinus rhythm, no STEMI.  PERC negative without shortness of breath.  Patient's labs have resulted and reveal no leukocytosis, flu A positive, K3.2 (oral replacement provided), delta troponin less than 15.  Provided patient Tussionex for cough with improvement.  Upon assessment, patient feels well to go home.  Patient is flu positive, symptoms consistent with same.  Doubt ACS/AAA/PE.  Discussed with patient that there is no indication for antibiotics for viral infections. Will send for cough syrup and recommend close outpatient follow-up with return precautions. Out of Tamiflu window.  Advised to follow-up with PCP for potassium recheck in a few days.  Evaluation and diagnostic testing in the emergency department does not suggest an emergent condition requiring admission or immediate intervention beyond what has been performed at this time.  Plan for discharge with close PCP follow-up.  Patient is understanding and amenable with plan, educated on red flag symptoms that would prompt immediate return.  Patient discharged in stable condition.          Evan Higgins A, PA-C 10/19/24 CLAIR    Tonia Chew, MD 10/20/24 [phone]

## 2024-10-19 NOTE — ED Notes (Signed)
 Pt requested nitroglycerin  for cp. RN informed pt BP is too low for medication at this time. RN will see if a different medication can be provided.

## 2024-10-20 ENCOUNTER — Other Ambulatory Visit (HOSPITAL_COMMUNITY): Payer: Self-pay
